# Patient Record
Sex: Female | Born: 2000
Health system: Southern US, Community
[De-identification: ages and names within clinical notes are randomized; demographics above are authoritative.]

## PROBLEM LIST (undated history)

## (undated) DIAGNOSIS — C801 Malignant (primary) neoplasm, unspecified: Secondary | ICD-10-CM

## (undated) DIAGNOSIS — E282 Polycystic ovarian syndrome: Secondary | ICD-10-CM

## (undated) DIAGNOSIS — K219 Gastro-esophageal reflux disease without esophagitis: Secondary | ICD-10-CM

## (undated) DIAGNOSIS — F419 Anxiety disorder, unspecified: Secondary | ICD-10-CM

## (undated) DIAGNOSIS — J45909 Unspecified asthma, uncomplicated: Secondary | ICD-10-CM

## (undated) DIAGNOSIS — Z8489 Family history of other specified conditions: Secondary | ICD-10-CM

## (undated) DIAGNOSIS — F32A Depression, unspecified: Secondary | ICD-10-CM

## (undated) DIAGNOSIS — G43909 Migraine, unspecified, not intractable, without status migrainosus: Secondary | ICD-10-CM

## (undated) HISTORY — DX: Gastro-esophageal reflux disease without esophagitis: K21.9

## (undated) HISTORY — DX: Depression, unspecified: F32.A

## (undated) HISTORY — DX: Anxiety disorder, unspecified: F41.9

## (undated) HISTORY — PX: WISDOM TOOTH EXTRACTION: SHX21

## (undated) HISTORY — DX: Migraine, unspecified, not intractable, without status migrainosus: G43.909

---
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
## 2020-09-17 ENCOUNTER — Other Ambulatory Visit: Payer: Self-pay

## 2020-09-17 ENCOUNTER — Inpatient Hospital Stay: Payer: 59

## 2020-09-17 ENCOUNTER — Encounter: Payer: Self-pay | Admitting: Cardiology

## 2020-09-17 ENCOUNTER — Ambulatory Visit: Payer: 59 | Admitting: Cardiology

## 2020-09-17 VITALS — BP 136/81 | HR 80 | Temp 98.1°F | Resp 16 | Ht 66.0 in | Wt 230.0 lb

## 2020-09-17 DIAGNOSIS — R002 Palpitations: Secondary | ICD-10-CM | POA: Insufficient documentation

## 2020-09-17 DIAGNOSIS — I499 Cardiac arrhythmia, unspecified: Secondary | ICD-10-CM

## 2020-09-17 DIAGNOSIS — R0789 Other chest pain: Secondary | ICD-10-CM

## 2020-09-17 NOTE — Progress Notes (Signed)
Patient referred by Nickola Major, MD for irregular heartbeat  Subjective:   Kiara Harris, female    DOB: 04-Oct-2000, 20 y.o.   MRN: [id number]   Chief Complaint  Patient presents with  . chest pain  . arrthythmia  . New Patient (Initial Visit)     HPI  20 y.o. Caucasian female with irregular heartbeat  Patient is a sophomore in college, plays softball.  For last few days, she has had episodes of "flutter sensation", and "irregular heartbeat", lasting for up to 10-15 minutes, associated with lightheadedness and chest pain.  She denies any shortness of breath, syncope symptoms.  She is able to participate in her sports activity without any difficulty.  She was referred by her PCP to undergo cardiac evaluation.   Past Medical History:  Diagnosis Date  . Acid reflux   . Anxiety and depression   . Asthma   . Migraine      History reviewed. No pertinent surgical history.   Social History   Tobacco Use  Smoking Status Never Smoker  Smokeless Tobacco Never Used    Social History   Substance and Sexual Activity  Alcohol Use No     History reviewed. No pertinent family history.   Current Outpatient Medications on File Prior to Visit  Medication Sig Dispense Refill  . albuterol (PROVENTIL HFA;VENTOLIN HFA) 108 (90 BASE) MCG/ACT inhaler Inhale two puffs prior to exercise 1 Inhaler 1  . albuterol (PROVENTIL) (2.5 MG/3ML) 0.083% nebulizer solution Take 2.5 mg by nebulization every 4 (four) hours as needed for wheezing.    . Butalbital-APAP-Caffeine 50-300-40 MG CAPS Take 1 capsule by mouth every 8 (eight) hours.    Marland Kitchen doxycycline (MONODOX) 50 MG capsule Take 50 mg by mouth 2 (two) times daily.    . Norgestimate-Ethinyl Estradiol Triphasic 0.18/0.215/0.25 MG-35 MCG tablet Take 1 tablet by mouth daily.    . ondansetron (ZOFRAN-ODT) 4 MG disintegrating tablet Take 4 mg by mouth every 8 (eight) hours as needed.    . pantoprazole (PROTONIX) 40 MG tablet Take 40 mg  by mouth 2 (two) times daily.     No current facility-administered medications on file prior to visit.    Cardiovascular and other pertinent studies:  EKG 09/17/2020: Sinus rhythm 67 bpm   Recent labs: 06/06/2020: Glucose 75, BUN/Cr 9/0.77. EGFR 138. Na/K 138/4.3. Rest of the CMP normal H/H 13/41. MCV 88. Platelets 291 TSH 1.7  normal    Review of Systems  Cardiovascular: Positive for chest pain and palpitations. Negative for dyspnea on exertion, leg swelling and syncope.         Vitals:   09/17/20 1442  BP: 136/81  Pulse: 80  Resp: 16  Temp: 98.1 F (36.7 C)  SpO2: 98%     Body mass index is 37.12 kg/m. Filed Weights   09/17/20 1442  Weight: 230 lb (104.3 kg)     Objective:   Physical Exam Vitals and nursing note reviewed.  Constitutional:      General: She is not in acute distress. Neck:     Vascular: No JVD.  Cardiovascular:     Rate and Rhythm: Normal rate and regular rhythm.     Pulses: Normal pulses.     Heart sounds: Normal heart sounds. No murmur heard.   Pulmonary:     Effort: Pulmonary effort is normal.     Breath sounds: Normal breath sounds. No wheezing or rales.  Musculoskeletal:     Right lower leg: No edema.  Left lower leg: No edema.            Assessment & Recommendations:   20 y.o. Caucasian female with irregular heartbeat  Soft systolic murmur, normal resting EKG.  Chest pain likely related to episodes of tachycardia.  Discussed vagal maneuvers.  I do not see any contraindication to continue participating in sports, unless she develops any exercise-induced symptoms.  Will obtain echocardiogram and 1 week cardiac telemetry.  Further recommendations after above testing.  Thank you for referring the patient to [place]. Please feel free to contact with any questions.   Nigel Mormon, MD Pager: [phone] Office: [phone]

## 2020-09-30 ENCOUNTER — Ambulatory Visit: Payer: 59

## 2020-09-30 ENCOUNTER — Other Ambulatory Visit: Payer: Self-pay

## 2020-09-30 DIAGNOSIS — R0789 Other chest pain: Secondary | ICD-10-CM

## 2020-09-30 DIAGNOSIS — I499 Cardiac arrhythmia, unspecified: Secondary | ICD-10-CM

## 2020-10-05 NOTE — Progress Notes (Signed)
Patient referred by Nickola Major, MD for irregular heartbeat  Subjective:   Kiara Harris, female    DOB: 05/28/2001, 20 y.o.   MRN: [id number]   Chief Complaint  Patient presents with  . Palpitations  . Follow-up  . Results     HPI  20 y.o. Caucasian female with irregular heartbeat  Workup was unremarkable, details below.  She did not have any palpitations episode but she will monitor.  However, 2 days after she stopped a monitor, she had episode of  tachycardia and dizziness during her softball game.  She was able to sit, relax and overcome this episode.  Initial consultation HPI 09/2020: Patient is a sophomore in college, plays softball.  For last few days, she has had episodes of "flutter sensation", and "irregular heartbeat", lasting for up to 10-15 minutes, associated with lightheadedness and chest pain.  She denies any shortness of breath, syncope symptoms.  She is able to participate in her sports activity without any difficulty.  She was referred by her PCP to undergo cardiac evaluation.   Current Outpatient Medications on File Prior to Visit  Medication Sig Dispense Refill  . albuterol (PROVENTIL HFA;VENTOLIN HFA) 108 (90 BASE) MCG/ACT inhaler Inhale two puffs prior to exercise 1 Inhaler 1  . albuterol (PROVENTIL) (2.5 MG/3ML) 0.083% nebulizer solution Take 2.5 mg by nebulization every 4 (four) hours as needed for wheezing.    . Butalbital-APAP-Caffeine 50-300-40 MG CAPS Take 1 capsule by mouth every 8 (eight) hours.    Marland Kitchen doxycycline (MONODOX) 50 MG capsule Take 50 mg by mouth 2 (two) times daily.    . Norgestimate-Ethinyl Estradiol Triphasic 0.18/0.215/0.25 MG-35 MCG tablet Take 1 tablet by mouth daily.    . ondansetron (ZOFRAN-ODT) 4 MG disintegrating tablet Take 4 mg by mouth every 8 (eight) hours as needed.    . pantoprazole (PROTONIX) 40 MG tablet Take 40 mg by mouth 2 (two) times daily.     No current facility-administered medications on file prior to  visit.    Cardiovascular and other pertinent studies:  Echocardiogram 09/30/2020: Left ventricle cavity is normal in size and wall thickness. Normal global wall motion. Normal LV systolic function with EF 65%. Normal diastolic filling pattern.  No significant valvular abnormality. Normal right atrial pressure.   Mobile cardiac telemetry 6 days 09/17/2020 - 09/24/2020: Dominant rhythm: Sinus. HR 42-196 bpm. Avg HR 75 bpm. <1% isolated SVE, couplet/triplets. <1% isolated VE, couplets No atrial fibrillation/atrial flutter/SVT/VT/high grade AV block, sinus pause >3sec noted. 0 patient triggered events.     EKG 09/17/2020: Sinus rhythm 67 bpm  Recent labs: 06/06/2020: Glucose 75, BUN/Cr 9/0.77. EGFR 138. Na/K 138/4.3. Rest of the CMP normal H/H 13/41. MCV 88. Platelets 291 TSH 1.7  normal    Review of Systems  Cardiovascular: Positive for chest pain and palpitations. Negative for dyspnea on exertion, leg swelling and syncope.         Vitals:   10/06/20 0902  BP: 128/68  Pulse: 60  Temp: 98.1 F (36.7 C)  SpO2: 98%     Body mass index is 37.45 kg/m. Filed Weights   10/06/20 0902  Weight: 232 lb (105.2 kg)     Objective:   Physical Exam Vitals and nursing note reviewed.  Constitutional:      General: She is not in acute distress. Neck:     Vascular: No JVD.  Cardiovascular:     Rate and Rhythm: Normal rate and regular rhythm.     Pulses: Normal  pulses.     Heart sounds: Normal heart sounds. No murmur heard.   Pulmonary:     Effort: Pulmonary effort is normal.     Breath sounds: Normal breath sounds. No wheezing or rales.  Musculoskeletal:     Right lower leg: No edema.     Left lower leg: No edema.            Assessment & Recommendations:   20 y.o. Caucasian female with irregular heartbeat  Workup with echocardiogram and cardiac monitor was essentially normal.  She had one episode of tachycardia after taking the monitor off.  Recommend  conservative therapy with albumin was found.  If she has increase frequency or severity of symptoms, could repeat monitor.  Suspicion for major ischemia is low.  F 3 months/u in    Nigel Mormon, MD Pager: [phone] Office: [phone]

## 2020-10-06 ENCOUNTER — Ambulatory Visit: Payer: 59 | Admitting: Cardiology

## 2020-10-06 ENCOUNTER — Encounter: Payer: Self-pay | Admitting: Cardiology

## 2020-10-06 ENCOUNTER — Telehealth: Payer: Self-pay

## 2020-10-06 ENCOUNTER — Other Ambulatory Visit: Payer: Self-pay

## 2020-10-06 VITALS — BP 128/68 | HR 60 | Temp 98.1°F | Ht 66.0 in | Wt 232.0 lb

## 2020-10-06 DIAGNOSIS — R0789 Other chest pain: Secondary | ICD-10-CM

## 2020-10-06 DIAGNOSIS — I499 Cardiac arrhythmia, unspecified: Secondary | ICD-10-CM

## 2020-10-06 DIAGNOSIS — R002 Palpitations: Secondary | ICD-10-CM

## 2020-10-06 NOTE — Telephone Encounter (Signed)
-----   Message from Nigel Mormon, MD sent at 10/06/2020  9:13 AM EST ----- Regarding: Appt & TOC Discharge follow up: TOC: Needed Follow up appt: Needed in 1-2 weeks, echcoardiogram in AM and appt in early PM Discharge diagnosis: Heart failure, pericardial effusion Discharge date: 2/28  Thanks MJP

## 2020-10-08 ENCOUNTER — Telehealth: Payer: Self-pay

## 2020-10-08 NOTE — Telephone Encounter (Signed)
-----   Message from Tera Partridge sent at 10/08/2020 10:27 AM EST ----- Regarding: RE: Appt & TOC Ladies - can a addendum be noted on this account that a TOC was incorrectly entered on this pt.  Thanks.  Anabell  ----- Message ----- From: Nigel Mormon, MD Sent: 10/06/2020   4:04 PM EST To: [facility], # Subject: FW: Appt & TOC                                 Disregard. Incorrect patient  Thanks MJP   ----- Message ----- From: Nigel Mormon, MD Sent: 10/06/2020   9:14 AM EST To: [facility], # Subject: Appt & TOC                                     Discharge follow up: TOC: Needed Follow up appt: Needed in 1-2 weeks, echcoardiogram in AM and appt in early PM Discharge diagnosis: Heart failure, pericardial effusion Discharge date: 2/28  Thanks MJP

## 2020-12-31 NOTE — Progress Notes (Signed)
Patient referred by Nickola Major, MD for irregular heartbeat  Subjective:   Kiara Harris, female    DOB: [birth year], 20 y.o.   MRN: [id number]   Chief Complaint  Patient presents with  . Irregular Heart Beat  . Palpitations  . Follow-up    3 month     HPI  20 y.o. Caucasian female with irregular heartbeat, chest pain  Workup was unremarkable, details below.  Patient is here today with her mother. Her softball season is over, so currently not actively playing sport. She had one episode first week of April. She had chest pain, whole body pain, shortness of breath, and lightheadedness during a game. She does not remember being stressed. Episode lasted for about an hour. She had to be taken off from the game. That said, she was able to participate in subsequent games without any symptoms.   Initial consultation HPI 09/2020: Patient is a sophomore in college, plays softball.  For last few days, she has had episodes of "flutter sensation", and "irregular heartbeat", lasting for up to 10-15 minutes, associated with lightheadedness and chest pain.  She denies any shortness of breath, syncope symptoms.  She is able to participate in her sports activity without any difficulty.  She was referred by her PCP to undergo cardiac evaluation.   Current Outpatient Medications on File Prior to Visit  Medication Sig Dispense Refill  . albuterol (PROVENTIL HFA;VENTOLIN HFA) 108 (90 BASE) MCG/ACT inhaler Inhale two puffs prior to exercise 1 Inhaler 1  . albuterol (PROVENTIL) (2.5 MG/3ML) 0.083% nebulizer solution Take 2.5 mg by nebulization every 4 (four) hours as needed for wheezing.    . Butalbital-APAP-Caffeine 50-300-40 MG CAPS Take 1 capsule by mouth every 8 (eight) hours.    Marland Kitchen doxycycline (MONODOX) 50 MG capsule Take 50 mg by mouth 2 (two) times daily.    . Norgestimate-Ethinyl Estradiol Triphasic 0.18/0.215/0.25 MG-35 MCG tablet Take 1 tablet by mouth daily.    . ondansetron  (ZOFRAN-ODT) 4 MG disintegrating tablet Take 4 mg by mouth every 8 (eight) hours as needed.    . pantoprazole (PROTONIX) 40 MG tablet Take 40 mg by mouth 2 (two) times daily.     No current facility-administered medications on file prior to visit.    Cardiovascular and other pertinent studies:  Echocardiogram 09/30/2020: Left ventricle cavity is normal in size and wall thickness. Normal global wall motion. Normal LV systolic function with EF 65%. Normal diastolic filling pattern.  No significant valvular abnormality. Normal right atrial pressure.   Mobile cardiac telemetry 6 days 09/17/2020 - 09/24/2020: Dominant rhythm: Sinus. HR 42-196 bpm. Avg HR 75 bpm. <1% isolated SVE, couplet/triplets. <1% isolated VE, couplets No atrial fibrillation/atrial flutter/SVT/VT/high grade AV block, sinus pause >3sec noted. 0 patient triggered events.     EKG 09/17/2020: Sinus rhythm 67 bpm  Recent labs: 06/06/2020: Glucose 75, BUN/Cr 9/0.77. EGFR 138. Na/K 138/4.3. Rest of the CMP normal H/H 13/41. MCV 88. Platelets 291 TSH 1.7  normal    Review of Systems  Cardiovascular: Positive for chest pain and palpitations. Negative for dyspnea on exertion, leg swelling and syncope.         Vitals:   01/01/21 0914  BP: 132/86  Pulse: 74  Resp: 16  Temp: 98 F (36.7 C)  SpO2: 98%     Body mass index is 37.12 kg/m. Filed Weights   01/01/21 0914  Weight: 230 lb (104.3 kg)     Objective:   Physical Exam Vitals and  nursing note reviewed.  Constitutional:      General: She is not in acute distress. Neck:     Vascular: No JVD.  Cardiovascular:     Rate and Rhythm: Normal rate and regular rhythm.     Pulses: Normal pulses.     Heart sounds: Normal heart sounds. No murmur heard.   Pulmonary:     Effort: Pulmonary effort is normal.     Breath sounds: Normal breath sounds. No wheezing or rales.  Musculoskeletal:     Right lower leg: No edema.     Left lower leg: No edema.             Assessment & Recommendations:   20 y.o. Caucasian female with irregular heartbeat, chest pain, shortness of breath  Workup with echocardiogram and cardiac monitor was essentially normal.  She had one episode of tachycardia after taking the monitor off. Infrequent episodes of chest pain and shortness of breath, not reproducible every time she exerts. I suspect these episodes may be panic attacks. Nonetheless, I will perform treadmill stress test for reassurance. If stress test is normal, I recommend her to discuss with her PCP re: possibility of workup and management for panic attacks.   Nigel Mormon, MD Pager: [phone] Office: [phone]

## 2021-01-01 ENCOUNTER — Other Ambulatory Visit: Payer: Self-pay

## 2021-01-01 ENCOUNTER — Ambulatory Visit: Payer: 59 | Admitting: Cardiology

## 2021-01-01 ENCOUNTER — Encounter: Payer: Self-pay | Admitting: Cardiology

## 2021-01-01 VITALS — BP 132/86 | HR 74 | Temp 98.0°F | Resp 16 | Ht 66.0 in | Wt 230.0 lb

## 2021-01-01 DIAGNOSIS — R002 Palpitations: Secondary | ICD-10-CM

## 2021-01-01 DIAGNOSIS — I499 Cardiac arrhythmia, unspecified: Secondary | ICD-10-CM

## 2021-01-01 DIAGNOSIS — R0789 Other chest pain: Secondary | ICD-10-CM

## 2021-07-24 ENCOUNTER — Other Ambulatory Visit: Payer: Self-pay | Admitting: Family Medicine

## 2021-07-24 DIAGNOSIS — M545 Low back pain, unspecified: Secondary | ICD-10-CM

## 2021-07-31 ENCOUNTER — Ambulatory Visit
Admission: RE | Admit: 2021-07-31 | Discharge: 2021-07-31 | Disposition: A | Discharge status: Home / Self Care | Payer: Self-pay | Source: Ambulatory Visit | Attending: Family Medicine | Admitting: Family Medicine

## 2021-07-31 ENCOUNTER — Other Ambulatory Visit: Payer: Self-pay

## 2021-07-31 DIAGNOSIS — G8929 Other chronic pain: Secondary | ICD-10-CM

## 2021-08-06 ENCOUNTER — Other Ambulatory Visit: Payer: Self-pay | Admitting: Family Medicine

## 2021-08-06 DIAGNOSIS — N2889 Other specified disorders of kidney and ureter: Secondary | ICD-10-CM

## 2021-08-07 ENCOUNTER — Ambulatory Visit
Admission: RE | Admit: 2021-08-07 | Discharge: 2021-08-07 | Disposition: A | Discharge status: Home / Self Care | Payer: 59 | Source: Ambulatory Visit | Attending: Family Medicine | Admitting: Family Medicine

## 2021-08-07 ENCOUNTER — Other Ambulatory Visit: Payer: Self-pay

## 2021-08-07 DIAGNOSIS — N2889 Other specified disorders of kidney and ureter: Secondary | ICD-10-CM

## 2021-08-07 MED ORDER — DIPHENHYDRAMINE HCL 50 MG/ML IJ SOLN
50.0000 mg | Freq: Once | INTRAMUSCULAR | Status: AC
Start: 2021-08-07 — End: 2021-08-07
  Administered 2021-08-07: 50 mg via INTRAVENOUS

## 2021-08-07 MED ORDER — GADOBENATE DIMEGLUMINE 529 MG/ML IV SOLN
20.0000 mL | Freq: Once | INTRAVENOUS | Status: AC | PRN
  Administered 2021-08-07: 20 mL via INTRAVENOUS

## 2021-08-07 NOTE — Progress Notes (Signed)
Pt brought over to nursing recovery unit post MRI scan with contrast due to patient complaining of itchy throat and itchy hands. No notable hives, swelling, or distress. Pt has no other complaints at this time and denies SOB or difficulty breathing. Dr. Jeralyn Ruths saw pt and assessed her with her mother at her side. 50 mg IV benadryl was given, per dr. Jeralyn Ruths. Pt reported itching resolved and will be discharged from facility per dr. Jeralyn Ruths. Pt and her mother advised that if patient were to need MRI contrast again that she would require a 13 hr prep. Pt/cg verbalized understanding.

## 2021-08-07 NOTE — Progress Notes (Signed)
I was called to see the patient in the nursing recovery area at [facility] for a contrast reaction following an MRI. The patient reported itchy/scratchy throat and mild hand itching. She was in no distress, was speaking in complete sentences, had no hives, and denied difficulty breathing. No concerning vital signs. 50 mg IV Benadryl given. Symptoms resolved at time of discharge. Patient and her mother were informed to seek immediate medical attention for any recurrent or new symptoms.

## 2021-08-12 ENCOUNTER — Other Ambulatory Visit: Payer: 59

## 2021-08-14 ENCOUNTER — Other Ambulatory Visit: Payer: Self-pay | Admitting: Family Medicine

## 2021-08-14 DIAGNOSIS — K862 Cyst of pancreas: Secondary | ICD-10-CM

## 2021-10-20 ENCOUNTER — Ambulatory Visit: Payer: 59 | Admitting: Radiology

## 2021-10-20 ENCOUNTER — Other Ambulatory Visit: Payer: Self-pay | Admitting: Orthopedic Surgery

## 2021-10-20 DIAGNOSIS — R531 Weakness: Secondary | ICD-10-CM

## 2021-10-20 DIAGNOSIS — R52 Pain, unspecified: Secondary | ICD-10-CM

## 2022-01-11 ENCOUNTER — Telehealth: Payer: Self-pay

## 2022-01-11 MED ORDER — PREDNISONE 50 MG PO TABS: ORAL_TABLET | ORAL | 0 refills | Status: DC

## 2022-01-11 MED ORDER — DIPHENHYDRAMINE HCL 50 MG PO TABS: 50.0000 mg | ORAL_TABLET | Freq: Once | ORAL | 0 refills | Status: DC

## 2022-01-11 NOTE — Telephone Encounter (Signed)
Phone call to patient, spoke to pts mother, to review instructions for 13 hr prep for MRI w/ contrast on 01/19/22 @ 8 AM. Prescription called into [facility]. Pt aware and verbalized understanding of instructions. Prescription: Pt to take 50 mg of prednisone on 01/18/22 @ 7pm, 50 mg of prednisone on 01/19/22 at 1 AM, and 50 mg of prednisone on 01/19/22 @ 7 AM. Pt is also to take 50 mg of benadryl on 01/19/22 at 7 AM. Please call [phone] with any questions.    Benadryl also sent in as a prescription, per the pts mother request. Pt/cg advised not to drive the day of taking this medication as it mau cause drowsiness. Pt/cg verbalized understanding.

## 2022-01-19 ENCOUNTER — Ambulatory Visit
Admission: RE | Admit: 2022-01-19 | Discharge: 2022-01-19 | Disposition: A | Discharge status: Home / Self Care | Payer: 59 | Source: Ambulatory Visit | Attending: Family Medicine | Admitting: Family Medicine

## 2022-01-19 DIAGNOSIS — K862 Cyst of pancreas: Secondary | ICD-10-CM

## 2022-01-19 MED ORDER — GADOBENATE DIMEGLUMINE 529 MG/ML IV SOLN
20.0000 mL | Freq: Once | INTRAVENOUS | Status: AC | PRN
Start: 2022-01-19 — End: 2022-01-19
  Administered 2022-01-19: 20 mL via INTRAVENOUS

## 2022-03-23 ENCOUNTER — Telehealth: Payer: Self-pay

## 2022-03-23 NOTE — Telephone Encounter (Signed)
-----   Message from Irving Copas., MD sent at 03/23/2022  1:07 PM EDT ----- Regarding: RE: EUS SA, Thanks for reaching out. This has not come through yet for Chanin Frumkin or I, though I do see the referral in the system.   EGD/EUS makes sense to try to better define and ensure that this is just an SCA.  The very small chance that this could be coming from the kidney will hopefully be able to be further examined/evaluated.  Bernese Doffing, Please reach out to the patient and offer her next available EGD/EUS linear with me.  Please let Dr. Zenia Resides and I know when she is scheduled. Thanks. GM ----- Message ----- From: Dwan Bolt, MD Sent: 03/23/2022   9:04 AM EDT To: Irving Copas., MD Subject: EUS                                            GM, I referred this patient with a pancreatic cyst for EUS a few weeks ago, just wanted to follow up and make sure she gets scheduled at some point. I know you guys are very busy right now, it's not urgent, I just wanted to make sure she gets follow up. Thanks, [place]

## 2022-03-24 ENCOUNTER — Other Ambulatory Visit: Payer: Self-pay

## 2022-03-24 DIAGNOSIS — K862 Cyst of pancreas: Secondary | ICD-10-CM

## 2022-03-24 NOTE — Telephone Encounter (Signed)
EGD EUS has been scheduled on 05/27/22 at 1015 am at [facility] with GM  Left message on machine to call back

## 2022-03-25 NOTE — Telephone Encounter (Signed)
PT mother called to get a new date for the EGD scheduled for 10/19. She is starting clinicals and needs it to be either the week before on a Tuesday or Thursday, or any time after 12/12. Please advise.

## 2022-03-25 NOTE — Telephone Encounter (Signed)
The pt has been moved to 10/9 at 115 pm with GM at [facility]. She has been re instructed and new information sent to My Chart.

## 2022-05-10 ENCOUNTER — Encounter (HOSPITAL_COMMUNITY): Payer: Self-pay | Admitting: Gastroenterology

## 2022-05-17 ENCOUNTER — Ambulatory Visit (HOSPITAL_COMMUNITY): Payer: 59 | Admitting: Certified Registered"

## 2022-05-17 ENCOUNTER — Ambulatory Visit (HOSPITAL_COMMUNITY)
Admission: RE | Admit: 2022-05-17 | Discharge: 2022-05-17 | Disposition: A | Discharge status: Home / Self Care | Payer: 59 | Attending: Gastroenterology | Admitting: Gastroenterology

## 2022-05-17 ENCOUNTER — Other Ambulatory Visit: Payer: Self-pay

## 2022-05-17 ENCOUNTER — Encounter (HOSPITAL_COMMUNITY): Payer: Self-pay | Admitting: Gastroenterology

## 2022-05-17 ENCOUNTER — Ambulatory Visit (HOSPITAL_BASED_OUTPATIENT_CLINIC_OR_DEPARTMENT_OTHER): Payer: 59 | Admitting: Certified Registered"

## 2022-05-17 ENCOUNTER — Encounter (HOSPITAL_COMMUNITY)
Admission: RE | Disposition: A | Discharge status: Home / Self Care | Payer: Self-pay | Source: Home / Self Care | Attending: Gastroenterology

## 2022-05-17 DIAGNOSIS — I899 Noninfective disorder of lymphatic vessels and lymph nodes, unspecified: Secondary | ICD-10-CM | POA: Diagnosis not present

## 2022-05-17 DIAGNOSIS — K862 Cyst of pancreas: Secondary | ICD-10-CM | POA: Insufficient documentation

## 2022-05-17 DIAGNOSIS — K838 Other specified diseases of biliary tract: Secondary | ICD-10-CM | POA: Insufficient documentation

## 2022-05-17 DIAGNOSIS — K219 Gastro-esophageal reflux disease without esophagitis: Secondary | ICD-10-CM | POA: Insufficient documentation

## 2022-05-17 DIAGNOSIS — K3189 Other diseases of stomach and duodenum: Secondary | ICD-10-CM | POA: Insufficient documentation

## 2022-05-17 DIAGNOSIS — J45909 Unspecified asthma, uncomplicated: Secondary | ICD-10-CM | POA: Insufficient documentation

## 2022-05-17 DIAGNOSIS — F418 Other specified anxiety disorders: Secondary | ICD-10-CM | POA: Diagnosis not present

## 2022-05-17 DIAGNOSIS — K2289 Other specified disease of esophagus: Secondary | ICD-10-CM | POA: Insufficient documentation

## 2022-05-17 DIAGNOSIS — K2970 Gastritis, unspecified, without bleeding: Secondary | ICD-10-CM | POA: Diagnosis not present

## 2022-05-17 DIAGNOSIS — K828 Other specified diseases of gallbladder: Secondary | ICD-10-CM | POA: Diagnosis not present

## 2022-05-17 HISTORY — PX: ESOPHAGOGASTRODUODENOSCOPY (EGD) WITH PROPOFOL: SHX5813

## 2022-05-17 HISTORY — PX: BIOPSY: SHX5522

## 2022-05-17 HISTORY — PX: EUS: SHX5427

## 2022-05-17 HISTORY — PX: FINE NEEDLE ASPIRATION: SHX5430

## 2022-05-17 LAB — PREGNANCY, URINE: Preg Test, Ur: NEGATIVE

## 2022-05-17 SURGERY — ESOPHAGOGASTRODUODENOSCOPY (EGD) WITH PROPOFOL
Anesthesia: Monitor Anesthesia Care

## 2022-05-17 MED ORDER — PROPOFOL 10 MG/ML IV BOLUS
INTRAVENOUS | Status: DC | PRN
  Administered 2022-05-17: 20 mg via INTRAVENOUS
  Administered 2022-05-17: 10 mg via INTRAVENOUS
  Administered 2022-05-17: 20 mg via INTRAVENOUS
  Administered 2022-05-17: 10 mg via INTRAVENOUS
  Administered 2022-05-17: 70 mg via INTRAVENOUS

## 2022-05-17 MED ORDER — CIPROFLOXACIN HCL 500 MG PO TABS: 500.0000 mg | ORAL_TABLET | Freq: Two times a day (BID) | ORAL | 0 refills | Status: AC

## 2022-05-17 MED ORDER — LACTATED RINGERS IV SOLN: INTRAVENOUS | Status: DC

## 2022-05-17 MED ORDER — SODIUM CHLORIDE 0.9 % IV SOLN: INTRAVENOUS | Status: DC

## 2022-05-17 MED ORDER — LIDOCAINE 2% (20 MG/ML) 5 ML SYRINGE
INTRAMUSCULAR | Status: DC | PRN
  Administered 2022-05-17: 40 mg via INTRAVENOUS

## 2022-05-17 MED ORDER — CIPROFLOXACIN IN D5W 400 MG/200ML IV SOLN
INTRAVENOUS | Status: AC
  Filled 2022-05-17: qty 200

## 2022-05-17 MED ORDER — CIPROFLOXACIN IN D5W 400 MG/200ML IV SOLN
INTRAVENOUS | Status: DC | PRN
  Administered 2022-05-17: 400 mg via INTRAVENOUS

## 2022-05-17 MED ORDER — PROPOFOL 500 MG/50ML IV EMUL
INTRAVENOUS | Status: DC | PRN
  Administered 2022-05-17: 150 ug/kg/min via INTRAVENOUS

## 2022-05-17 SURGICAL SUPPLY — 15 items

## 2022-05-17 NOTE — Anesthesia Preprocedure Evaluation (Addendum)
Anesthesia Evaluation  Patient identified by MRN, date of birth, ID band Patient awake    Reviewed: Allergy & Precautions, NPO status , Patient's Chart, lab work & pertinent test results  Airway Mallampati: I  TM Distance: >3 FB Neck ROM: Full    Dental  (+) Teeth Intact, Dental Advisory Given   Pulmonary neg shortness of breath, asthma , neg sleep apnea, neg COPD, neg recent URI,    breath sounds clear to auscultation       Cardiovascular negative cardio ROS   Rhythm:Regular     Neuro/Psych  Headaches, neg Seizures PSYCHIATRIC DISORDERS Anxiety Depression    GI/Hepatic Neg liver ROS, GERD  ,panc cyst   Endo/Other  negative endocrine ROS  Renal/GU negative Renal ROS     Musculoskeletal negative musculoskeletal ROS (+)   Abdominal   Peds  Hematology negative hematology ROS (+)   Anesthesia Other Findings   Reproductive/Obstetrics negative OB ROS                            Anesthesia Physical Anesthesia Plan  ASA: 2  Anesthesia Plan: MAC   Post-op Pain Management: Minimal or no pain anticipated   Induction: Intravenous  PONV Risk Score and Plan: 2 and Propofol infusion and Treatment may vary due to age or medical condition  Airway Management Planned: Nasal Cannula and Natural Airway  Additional Equipment: None  Intra-op Plan:   Post-operative Plan:   Informed Consent: I have reviewed the patients History and Physical, chart, labs and discussed the procedure including the risks, benefits and alternatives for the proposed anesthesia with the patient or authorized representative who has indicated his/her understanding and acceptance.     Dental advisory given  Plan Discussed with: CRNA  Anesthesia Plan Comments:        Anesthesia Quick Evaluation

## 2022-05-17 NOTE — Transfer of Care (Signed)
Immediate Anesthesia Transfer of Care Note  Patient: Cleora P Santibanez  Procedure(s) Performed: ESOPHAGOGASTRODUODENOSCOPY (EGD) WITH PROPOFOL UPPER ENDOSCOPIC ULTRASOUND (EUS) LINEAR BIOPSY FINE NEEDLE ASPIRATION (FNA) LINEAR  Patient Location: PACU  Anesthesia Type:MAC  Level of Consciousness: awake, alert , oriented and patient cooperative  Airway & Oxygen Therapy: Patient Spontanous Breathing and Patient connected to face mask oxygen  Post-op Assessment: Report given to RN and Post -op Vital signs reviewed and stable  Post vital signs: Reviewed and stable  Last Vitals:  Vitals Value Taken Time  BP 97/33 05/17/22 1330  Temp    Pulse 58 05/17/22 1331  Resp 20 05/17/22 1331  SpO2 99 % 05/17/22 1331  Vitals shown include unvalidated device data.  Last Pain:  Vitals:   05/17/22 1205  TempSrc: Temporal  PainSc: 6       Patients Stated Pain Goal: 4 (45/73/34 4830)  Complications: No notable events documented.

## 2022-05-17 NOTE — Discharge Instructions (Signed)
YOU HAD AN ENDOSCOPIC PROCEDURE TODAY: Refer to the procedure report and other information in the discharge instructions given to you for any specific questions about what was found during the examination. If this information does not answer your questions, please call [place] office at [phone] to clarify.   YOU SHOULD EXPECT: Some feelings of bloating in the abdomen. Passage of more gas than usual. Walking can help get rid of the air that was put into your GI tract during the procedure and reduce the bloating. If you had a lower endoscopy (such as a colonoscopy or flexible sigmoidoscopy) you may notice spotting of blood in your stool or on the toilet paper. Some abdominal soreness may be present for a day or two, also.  DIET: Your first meal following the procedure should be a light meal and then it is ok to progress to your normal diet. A half-sandwich or bowl of soup is an example of a good first meal. Heavy or fried foods are harder to digest and may make you feel nauseous or bloated. Drink plenty of fluids but you should avoid alcoholic beverages for 24 hours. If you had a esophageal dilation, please see attached instructions for diet.    ACTIVITY: Your care partner should take you home directly after the procedure. You should plan to take it easy, moving slowly for the rest of the day. You can resume normal activity the day after the procedure however YOU SHOULD NOT DRIVE, use power tools, machinery or perform tasks that involve climbing or major physical exertion for 24 hours (because of the sedation medicines used during the test).   SYMPTOMS TO REPORT IMMEDIATELY: A gastroenterologist can be reached at any hour. Please call [phone]  for any of the following symptoms:   Following upper endoscopy (EGD, EUS, ERCP, esophageal dilation) Vomiting of blood or coffee ground material  New, significant abdominal pain  New, significant chest pain or pain under the shoulder blades  Painful or  persistently difficult swallowing  New shortness of breath  Black, tarry-looking or red, bloody stools  FOLLOW UP:  If any biopsies were taken you will be contacted by phone or by letter within the next 1-3 weeks. Call [phone]  if you have not heard about the biopsies in 3 weeks.  Please also call with any specific questions about appointments or follow up tests.  

## 2022-05-17 NOTE — Op Note (Addendum)
[facility] Patient Name: Kiara Harris Procedure Date: 05/17/2022 MRN: [id number] Attending MD: Justice Britain , MD Date of Birth: 20-Aug-2000 CSN: [id number] Age: 21 Admit Type: Outpatient Procedure:                Upper EUS Indications:              Pancreatic cyst on MRCP Providers:                Justice Britain, MD, Dulcy Fanny, Cletis Athens, Technician Referring MD:             Blanchard Mane. Allen Medicines:                Monitored Anesthesia Care Complications:            No immediate complications. Estimated Blood Loss:     Estimated blood loss was minimal. Procedure:                Pre-Anesthesia Assessment:                           - Prior to the procedure, a History and Physical                            was performed, and patient medications and                            allergies were reviewed. The patient's tolerance of                            previous anesthesia was also reviewed. The risks                            and benefits of the procedure and the sedation                            options and risks were discussed with the patient.                            All questions were answered, and informed consent                            was obtained. Prior Anticoagulants: The patient has                            taken no previous anticoagulant or antiplatelet                            agents except for NSAID medication. ASA Grade                            Assessment: II - A patient with mild systemic  disease. After reviewing the risks and benefits,                            the patient was deemed in satisfactory condition to                            undergo the procedure.                           After obtaining informed consent, the endoscope was                            passed under direct vision. Throughout the                            procedure, the patient's  blood pressure, pulse, and                            oxygen saturations were monitored continuously. The                            [id number]-H190 ([id number]) Olympus endoscope was introduced                            through the mouth, and advanced to the second part                            of duodenum. The [id number]-[id number] ([id number]) Olympus                            duodenoscope was introduced through the mouth, and                            advanced to the area of papilla. The [id number]                            ([id number]) Olympus linear ultrasound scope was                            introduced through the mouth, and advanced to the                            duodenum for ultrasound examination from the                            stomach and duodenum. The upper EUS was                            accomplished without difficulty. The patient                            tolerated the procedure. Scope In: Scope Out: Findings:      ENDOSCOPIC FINDING: :      No gross lesions were noted in the entire  esophagus.      The Z-line was irregular and was found 35 cm from the incisors.      Patchy mildly erythematous mucosa without bleeding was found in the       entire examined stomach. Biopsies were taken with a cold forceps for       histology and Helicobacter pylori testing.      No gross lesions were noted in the duodenal bulb, in the first portion       of the duodenum and in the second portion of the duodenum.      The major papilla was small but otherwise normal in appearance.      ENDOSONOGRAPHIC FINDING: :      An anechoic lesion suggestive of a cyst was identified in the very       distal pancreas tail/pancreatic tail region. It is not in obvious       communication with the pancreatic duct. The lesion measured 44 mm by 38       mm in maximal cross-sectional diameter. There were many compartments       thinly septated. The outer wall of the lesion was thin. There was no       associated mass.  There was no internal debris within the fluid-filled       cavity. As noted below, I did not see any abnormal echogenicity of the       left kidney and this did seem to be slightly away from the left kidney       itself. As such, it was important for [place] to get further diagnostic       evaluation. Thus a diagnostic needle aspiration for fluid was performed.       Color Doppler imaging was utilized prior to needle puncture to confirm a       lack of significant vascular structures within the needle path. One pass       was made with the [facility] scientific expect 22 gauge needle using a       transgastric approach. A stylet was used. The amount of fluid collected       was proximately 40 mL. The fluid was clear, white and thin. Sample(s)       were sent for amylase concentration, cytology, CEA, glucose, and       GNAS/KRAS mutation analysis (if CEA greater than 192 and/or glucose less       than 50).      Other than some hyperechoic stranding, there was no other sign of       significant endosonographic abnormality in the pancreatic head (1.7 mm),       genu of the pancreas (1.7 mm), pancreatic body (1.0 mm), pancreatic tail       (0.7 mm) and uncinate process of the pancreas. No masses, the pancreatic       duct was thin in caliber.      There was no sign of significant endosonographic abnormality in the       common bile duct (2.0 mm to 4.0 mm) and in the common hepatic duct (5.6       mm). No stones and ducts of normal caliber were identified.      A small amount of hyperechoic material consistent with sludge was       visualized endosonographically in the gallbladder.      Endosonographic imaging of the ampulla showed no intramural       (subepithelial) lesion.  Endosonographic imaging in the visualized portion of the liver showed no       mass.      No malignant-appearing lymph nodes were visualized in the celiac region       (level 20), peripancreatic region and porta hepatis  region.      The celiac region was visualized. Impression:               EGD impression:                           - No gross lesions in esophagus. Z-line irregular,                            35 cm from the incisors.                           - Erythematous mucosa in the stomach. Biopsied.                           - No gross lesions in the duodenal bulb, in the                            first portion of the duodenum and in the second                            portion of the duodenum.                           - A small but otherwise normal major papilla.                           EUS impression:                           - A cystic lesion was seen in the very distal                            pancreatic tail/pancreatic tail region. This looks                            to be separate from the left kidney. Cytology                            results are pending. However, the endosonographic                            appearance initially prior to initial aspiration                            was not typical of a serous cystadenoma but after                            aspiration of fluid had a more typical appearance  with some potential central scarring. Fine needle                            aspiration for fluid was performed.                           - Other than some hyperechoic stranding, there was                            no other sign of significant pathology in the                            pancreatic head, genu of the pancreas, pancreatic                            body, pancreatic tail and uncinate process of the                            pancreas.                           - There was no sign of significant pathology in the                            common bile duct and in the common hepatic duct.                           - Hyperechoic material consistent with sludge was                            visualized endosonographically in the  gallbladder.                           - No malignant-appearing lymph nodes were                            visualized in the celiac region (level 20),                            peripancreatic region and porta hepatis region. Moderate Sedation:      Not Applicable - Patient had care per Anesthesia. Recommendation:           - The patient will be observed post-procedure,                            until all discharge criteria are met.                           - Discharge patient to home.                           - Patient has a contact number available for  emergencies. The signs and symptoms of potential                            delayed complications were discussed with the                            patient. Return to normal activities tomorrow.                            Written discharge instructions were provided to the                            patient.                           - Low fat diet x1 week.                           - Ciprofloxacin 500 mg twice x3 days                           - Observe patient's clinical course.                           - Await cytology results and await path results.                           - Follow-up to be considered based on results of                            fluid analysis. This may include repeat MRI/MRCP                            imaging if this turns out to be benign fluid                            analysis versus if this is a mucinous cystic                            neoplasm the consideration of need for resection.                           - The findings and recommendations were discussed                            with the patient.                           - The findings and recommendations were discussed                            with the patient's family. Procedure Code(s):        --- Professional ---  43238, Esophagogastroduodenoscopy, flexible,                             transoral; with transendoscopic ultrasound-guided                            intramural or transmural fine needle                            aspiration/biopsy(s), (includes endoscopic                            ultrasound examination limited to the esophagus,                            stomach or duodenum, and adjacent structures) Diagnosis Code(s):        --- Professional ---                           K22.8, Other specified diseases of esophagus                           K31.89, Other diseases of stomach and duodenum                           K86.2, Cyst of pancreas                           I89.9, Noninfective disorder of lymphatic vessels                            and lymph nodes, unspecified                           K83.8, Other specified diseases of biliary tract CPT copyright 2019 [employer]. All rights reserved. The codes documented in this report are preliminary and upon coder review may  be revised to meet current compliance requirements. Justice Britain, MD 05/17/2022 1:44:16 PM Number of Addenda: 0

## 2022-05-17 NOTE — H&P (Signed)
GASTROENTEROLOGY PROCEDURE H&P NOTE   Primary Care Physician: Nickola Major, MD  HPI: Kiara Harris is a 21 y.o. female who presents for EGD/EUS to evaluate pancreatic tail cyst versus renal cyst and possible FNA.  Past Medical History:  Diagnosis Date   Acid reflux    Anxiety and depression    Asthma    Migraine    History reviewed. No pertinent surgical history. No current facility-administered medications for this encounter.   No current facility-administered medications for this encounter. Allergies  Allergen Reactions   Latex Other (See Comments)    With prolonged exposure=itching.   Amoxil [Amoxicillin] Rash   Multihance [Gadobenate] Itching    Hands and throat itching after MRI scan on 08/07/21. Pt would need 13 hr prep prior to MRI contrast. - Dr. Logan Bores.    History reviewed. No pertinent family history. Social History   Socioeconomic History   Marital status: Single    Spouse name: Not on file   Number of children: 0   Years of education: Not on file   Highest education level: Not on file  Occupational History   Not on file  Tobacco Use   Smoking status: Never   Smokeless tobacco: Never  Vaping Use   Vaping Use: Every day  Substance and Sexual Activity   Alcohol use: No   Drug use: No   Sexual activity: Not on file  Other Topics Concern   Not on file  Social History Narrative   Not on file   Social Determinants of Health   Financial Resource Strain: Not on file  Food Insecurity: Not on file  Transportation Needs: Not on file  Physical Activity: Not on file  Stress: Not on file  Social Connections: Not on file  Intimate Partner Violence: Not on file    Physical Exam: There were no vitals filed for this visit. There is no height or weight on file to calculate BMI. GEN: NAD EYE: Sclerae anicteric ENT: MMM CV: Non-tachycardic GI: Soft, NT/ND NEURO:  Alert & Oriented x 3  Lab Results: No results for input(s): "WBC", "HGB",  "HCT", "PLT" in the last 72 hours. BMET No results for input(s): "NA", "K", "CL", "CO2", "GLUCOSE", "BUN", "CREATININE", "CALCIUM" in the last 72 hours. LFT No results for input(s): "PROT", "ALBUMIN", "AST", "ALT", "ALKPHOS", "BILITOT", "BILIDIR", "IBILI" in the last 72 hours. PT/INR No results for input(s): "LABPROT", "INR" in the last 72 hours.   Impression / Plan: This is a 21 y.o.female who presents for EGD/EUS to evaluate pancreatic tail cyst versus renal cyst and possible FNA.  The risks of an EUS including intestinal perforation, bleeding, infection, aspiration, and medication effects were discussed as was the possibility it may not give a definitive diagnosis if a biopsy is performed.  When a biopsy of the pancreas is done as part of the EUS, there is an additional risk of pancreatitis at the rate of about 1-2%.  It was explained that procedure related pancreatitis is typically mild, although it can be severe and even life threatening, which is why we do not perform random pancreatic biopsies and only biopsy a lesion/area we feel is concerning enough to warrant the risk.   The risks and benefits of endoscopic evaluation/treatment were discussed with the patient and/or family; these include but are not limited to the risk of perforation, infection, bleeding, missed lesions, lack of diagnosis, severe illness requiring hospitalization, as well as anesthesia and sedation related illnesses.  The patient's history has been  reviewed, patient examined, no change in status, and deemed stable for procedure.  The patient and/or family is agreeable to proceed.    Justice Britain, MD [facility] Office # [phone]

## 2022-05-18 ENCOUNTER — Encounter: Payer: Self-pay | Admitting: Gastroenterology

## 2022-05-18 LAB — CYTOLOGY - NON PAP

## 2022-05-18 NOTE — Anesthesia Postprocedure Evaluation (Signed)
Anesthesia Post Note  Patient: Kiara Harris  Procedure(s) Performed: ESOPHAGOGASTRODUODENOSCOPY (EGD) WITH PROPOFOL UPPER ENDOSCOPIC ULTRASOUND (EUS) LINEAR BIOPSY FINE NEEDLE ASPIRATION (FNA) LINEAR     Patient location during evaluation: Endoscopy Anesthesia Type: MAC Level of consciousness: awake and alert Pain management: pain level controlled Vital Signs Assessment: post-procedure vital signs reviewed and stable Respiratory status: spontaneous breathing, nonlabored ventilation and respiratory function stable Cardiovascular status: stable and blood pressure returned to baseline Postop Assessment: no apparent nausea or vomiting Anesthetic complications: no   No notable events documented.  Last Vitals:  Vitals:   05/17/22 1350 05/17/22 1400  BP: 135/69 (!) 146/80  Pulse: (!) 54 (!) 48  Resp: 18 13  Temp:    SpO2: 100% 100%    Last Pain:  Vitals:   05/17/22 1400  TempSrc:   PainSc: 0-No pain                 Jess Toney

## 2022-05-20 ENCOUNTER — Encounter (HOSPITAL_COMMUNITY): Payer: Self-pay | Admitting: Gastroenterology

## 2022-05-20 LAB — SURGICAL PATHOLOGY

## 2022-05-25 ENCOUNTER — Telehealth: Payer: Self-pay | Admitting: Gastroenterology

## 2022-05-26 NOTE — Telephone Encounter (Signed)
I have sent this information to the pt PCP as directed

## 2022-05-26 NOTE — Telephone Encounter (Signed)
[facility] results  CEA 13,460 ng/mL Amylase 20 units/L Glucose 4.3 mg/dL  Cytopathology Neoplastic cells noted Epithelial atypia no atypia Cellularity moderate Cell composition scant epithelial cells and macrophages Mucin present Proteinaceous debris absent Inflammation absent  KRAS/GNAS testing is still pending   I called and spoke with the patient and her mother about the results of these findings. 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  We now also have in the fluid itself neoplastic appearing cells.  The next steps in her evaluation will be for follow-up with Dr. Zenia Resides to discuss the consideration of distal pancreatectomy for removal of this pancreatic cyst and the distal pancreas.  I briefly discussed the potential role of splenectomy that would likely be considered as well.  I also told the patient and mother that updated imaging may be considered as well as some additional blood tests. Dr. Zenia Resides and I discussed these results briefly and she will have our team reach out to the patient to get her set up for follow-up in clinic to discuss things further and consider additional laboratory evaluation/imaging if needed. We will update the team when the KRAS/GNAS testing returns. We will make copies of the results and have them scanned into the chart and sent to the patient for her records. All questions were answered to the best of my ability. I will be available as needed for any further issues in the future.   Kiara Britain, MD [facility] Office # [phone]

## 2022-05-28 ENCOUNTER — Encounter (HOSPITAL_COMMUNITY): Payer: Self-pay

## 2022-05-28 ENCOUNTER — Emergency Department (HOSPITAL_COMMUNITY)
Admission: EM | Admit: 2022-05-28 | Discharge: 2022-05-28 | Discharge status: Against Medical Advice | Payer: 59 | Attending: Emergency Medicine | Admitting: Emergency Medicine

## 2022-05-28 ENCOUNTER — Other Ambulatory Visit: Payer: Self-pay

## 2022-05-28 ENCOUNTER — Telehealth: Payer: Self-pay | Admitting: Gastroenterology

## 2022-05-28 DIAGNOSIS — R110 Nausea: Secondary | ICD-10-CM | POA: Diagnosis not present

## 2022-05-28 DIAGNOSIS — Z5321 Procedure and treatment not carried out due to patient leaving prior to being seen by health care provider: Secondary | ICD-10-CM | POA: Diagnosis not present

## 2022-05-28 DIAGNOSIS — R1012 Left upper quadrant pain: Secondary | ICD-10-CM | POA: Insufficient documentation

## 2022-05-28 HISTORY — DX: Malignant (primary) neoplasm, unspecified: C80.1

## 2022-05-28 LAB — CBC WITH DIFFERENTIAL/PLATELET
Abs Immature Granulocytes: 0.06 10*3/uL (ref 0.00–0.07)
Basophils Absolute: 0.1 10*3/uL (ref 0.0–0.1)
Basophils Relative: 1 %
Eosinophils Absolute: 0.1 10*3/uL (ref 0.0–0.5)
Eosinophils Relative: 1 %
HCT: 47.2 % — ABNORMAL HIGH (ref 36.0–46.0)
Hemoglobin: 14.4 g/dL (ref 12.0–15.0)
Immature Granulocytes: 1 %
Lymphocytes Relative: 34 %
Lymphs Abs: 2.9 10*3/uL (ref 0.7–4.0)
MCH: 30.4 pg (ref 26.0–34.0)
MCHC: 30.5 g/dL (ref 30.0–36.0)
MCV: 99.8 fL (ref 80.0–100.0)
Monocytes Absolute: 0.7 10*3/uL (ref 0.1–1.0)
Monocytes Relative: 9 %
Neutro Abs: 4.5 10*3/uL (ref 1.7–7.7)
Neutrophils Relative %: 54 %
Platelets: 296 10*3/uL (ref 150–400)
RBC: 4.73 MIL/uL (ref 3.87–5.11)
RDW: 12.2 % (ref 11.5–15.5)
WBC: 8.3 10*3/uL (ref 4.0–10.5)
nRBC: 0 % (ref 0.0–0.2)

## 2022-05-28 LAB — COMPREHENSIVE METABOLIC PANEL
ALT: 29 U/L (ref 0–44)
AST: 30 U/L (ref 15–41)
Albumin: 4.3 g/dL (ref 3.5–5.0)
Alkaline Phosphatase: 35 U/L — ABNORMAL LOW (ref 38–126)
Anion gap: 8 (ref 5–15)
BUN: 12 mg/dL (ref 6–20)
CO2: 22 mmol/L (ref 22–32)
Calcium: 9.2 mg/dL (ref 8.9–10.3)
Chloride: 105 mmol/L (ref 98–111)
Creatinine, Ser: 0.74 mg/dL (ref 0.44–1.00)
GFR, Estimated: >60 mL/min (ref >60)
Glucose, Bld: 85 mg/dL (ref 70–99)
Potassium: 4.3 mmol/L (ref 3.5–5.1)
Sodium: 135 mmol/L (ref 135–145)
Total Bilirubin: 1 mg/dL (ref 0.3–1.2)
Total Protein: 7.5 g/dL (ref 6.5–8.1)

## 2022-05-28 LAB — LIPASE, BLOOD: Lipase: 42 U/L (ref 11–51)

## 2022-05-28 LAB — I-STAT BETA HCG BLOOD, ED (MC, WL, AP ONLY): I-stat hCG, quantitative: <5 m[IU]/mL (ref <5)

## 2022-05-28 MED ORDER — ONDANSETRON 4 MG PO TBDP
4.0000 mg | ORAL_TABLET | Freq: Once | ORAL | Status: AC
  Administered 2022-05-28: 4 mg via ORAL
  Filled 2022-05-28: qty 1

## 2022-05-28 NOTE — Telephone Encounter (Signed)
The pt's mother called to state that the pt is experiencing severe left side abd pain, nausea and vomiting.     See below the message from 10/17.    I have advised that she needs to go to the ED for eval due to the severity of the pain and her history.   "mucinous cystic neoplasm.  We now also have in the fluid itself neoplastic appearing cells.  The next steps in her evaluation will be for follow-up with Dr. Zenia Resides to discuss the consideration of distal pancreatectomy for removal of this pancreatic cyst and the distal pancreas."

## 2022-05-28 NOTE — Telephone Encounter (Signed)
Inbound call from patient mom stating patient is in class at the moment in pain. Patient mom is requesting someone to give her a call back since patient is in class . Please advise.  Thank you

## 2022-05-28 NOTE — ED Triage Notes (Addendum)
Patient c/o left upper abdominal pain and nausea that started today.  Patient reports a recent  EGD with a biopsy. Patient states her biopsy came back positive for cancer.

## 2022-05-28 NOTE — Telephone Encounter (Signed)
Noted  

## 2022-05-28 NOTE — Telephone Encounter (Signed)
Patty, Thank you for letting me know. I actually called and spoke with the mom this morning to and the daughter (patient) had just arrived home. She has had this acute onset of pain starting earlier today after eating breakfast that has just become constant and progressive and is causing her to have a shortness of breath sensation as well. I agree with the plan of action of evaluation in the emergency department and I have recommended she come to [facility] or [facility] or come to [facility] to be further evaluated. This far out it would be hard to imagine she had pancreatitis develop from our EUS more than 2 weeks out but certainly need to keep that in mind the other thing would be infectious risk but she completed antibiotics and had not had any issues when we had spoken with her earlier. Laboratories and imaging make the most sense in her further evaluation. Please check in with her next week to see where things stand. Thanks. GM  FYI SA in regards to mutual patient in case it causes issues with your follow-up in clinic next week.

## 2022-05-28 NOTE — ED Notes (Signed)
Pt sts she is feeling a lot better than she did when she arrived here. Pt is following up with her surgeon next week but is going home now.

## 2022-05-28 NOTE — ED Provider Triage Note (Cosign Needed Addendum)
Emergency Medicine Provider Triage Evaluation Note  Armona , a 21 y.o. female  was evaluated in triage.  Pt complains of sudden onset of left upper quadrant abdominal pain that is gradually worsened throughout the day today.  Started this morning, try to go to school, noticed the pain continued to worsen.  Pain described as achy and throbbing, and causing her to feel nauseous however has not vomited.  Denies fever, urinary symptoms, or changes in bowel habits.  Recent EGD on 10/9 of suspicious mass on pancreatic tail, biopsy came back positive for malignancy.  Denies recent trauma or injury.  Review of Systems  Positive:  Negative: See above  Physical Exam  BP (!) 155/116   Pulse (!) 57   Temp 98.3 F (36.8 C) (Oral)   Resp 18   Ht '5\' 6"'$  (1.676 m)   Wt 117 kg   LMP 05/26/2022 Comment: HCG sent  SpO2 100%   BMI 41.64 kg/m  Gen:   Awake, no distress   Resp:  Normal effort, equal chest rise MSK:   Moves extremities without difficulty  Other:  LUQ tenderness, abdomen soft, protuberant  Medical Decision Making  Medically screening exam initiated at 1:22 PM.  Appropriate orders placed.  Breeona P Goguen was informed that the remainder of the evaluation will be completed by another provider, this initial triage assessment does not replace that evaluation, and the importance of remaining in the ED until their evaluation is complete.     Prince Rome, PA-C 71/16/57 [id number]    Prince Rome, PA-C 33/38/32 [id number]

## 2022-06-01 ENCOUNTER — Telehealth: Payer: Self-pay | Admitting: Gastroenterology

## 2022-06-01 NOTE — Telephone Encounter (Signed)
[facility] laboratories  Updated and finalized report  CEA 13,460 ng/mL Amylase 20 units/L Glucose less than 4.3 mg/dL  Cytology = NO MALIGNANT CELLS (NO NEOPLASM)  DNA quantity/quality = low quantity/poor quality (degraded)  Oncogene point mutations GNAS = no mutation KRAS = no mutation  Tumor suppressor gene (LOH) No LOH detected   The findings today were in stark contrast to the previous cytopathology that showed an documented neoplastic cells.  I immediately called [facility] and discussed this case with their pathologist.  Unfortunately what was sent and documented was not correct previously.  There is no evidence of any malignant cells or premalignant cells in any of the fluid studies.  They were apologetic for this mistake and will be rectifying this with updated cytopathology report as well as an updated final [facility] report.  This should be faxed within the next couple of days to us and we will document this and recorded into the chart and send copies for the patient's records as well.  I called and discussed this with both the patient and patient's mom and apologize for this incorrect finding initially that had been sent to us.  They were very appreciative for the call back and are quite thankful that there is no evidence of any cancer cells at this time.  They still understand that the cyst looks to be a premalignant appearing cyst based on fluid analysis and may still require potential consideration for resection but not as urgently versus very close monitoring and surveillance.  They have an upcoming clinic visit with Dr. Allen later this week.  I actually already called and spoke with Dr. Allen as well so I have updated her about these results.  No further follow-up currently but will see what Dr. Allen and the patient decide in regards to her overall follow-up in the coming weeks.   Gabriel Mansouraty, MD [facility] Office # [phone] 

## 2022-06-02 ENCOUNTER — Ambulatory Visit: Payer: Self-pay | Admitting: Surgery

## 2022-06-02 DIAGNOSIS — K862 Cyst of pancreas: Secondary | ICD-10-CM

## 2022-06-02 NOTE — Telephone Encounter (Signed)
Sent to PCP as ordered

## 2022-06-02 NOTE — H&P (Signed)
History of Present Illness: Kiara Harris is a 21 y.o. female who is seen today for follow up of a cystic mass in the tail of the pancreas. This was incidentally diagnosed in December 2022 on a lumbar spine MRI. Since her last visit with me, she recently had an EUS by Dr. Rush Landmark on 05/17/22, which showed a 4.4cm cystic mass in the tail of the pancreas with many septations. FNA showed inflammatory cells but no malignant cells. Cyst fluid studies were as follows:   CEA 13,450 ng/ml Amylase 20 U/L   She is here today to discuss the next steps. She had some abdominal pain a few days following the EUS and went to the ED. Labs, including lipase, were unremarkable, but she left before being fully evaluated or obtaining imaging. She says she still has some back pain but overall feels better.    Review of Systems: A complete review of systems was obtained from the patient.  I have reviewed this information and discussed as appropriate with the patient.  See HPI as well for other ROS.       Medical History: Past Medical History History reviewed. No pertinent past medical history.    Patient Active Problem List Diagnosis  Pancreatic cyst     Past Surgical History History reviewed. No pertinent surgical history.     Allergies Allergies Allergen Reactions  Amoxicillin Rash      No current outpatient medications on file prior to visit.   No current facility-administered medications on file prior to visit.     Family History Family History Problem Relation Age of Onset  Obesity Mother    Obesity Father    Stroke Maternal Grandfather    Hyperlipidemia (Elevated cholesterol) Maternal Grandfather    High blood pressure (Hypertension) Maternal Grandfather    Coronary Artery Disease (Blocked arteries around heart) Maternal Grandfather        Social History   Tobacco Use Smoking Status Never Smokeless Tobacco Not on file     Social HistoryExpand by Default Social History     Socioeconomic History  Marital status: Single Tobacco Use  Smoking status: Never Vaping Use  Vaping Use: Every day Substance and Sexual Activity  Alcohol use: Yes     Comment: occasionally  Drug use: Never      Objective:     Vitals:   06/02/22 1452 BP: (!) 140/100 Pulse: 89 Temp: 36.1 C (97 F) SpO2: 96% Weight: (!) 120.7 kg (266 lb) Height: 167.6 cm ('5\' 6"'$ )   Body mass index is 42.93 kg/m.   Physical Exam Vitals reviewed.  Constitutional:      General: She is not in acute distress.    Appearance: Normal appearance.  HENT:     Head: Normocephalic and atraumatic.  Eyes:     General: No scleral icterus.    Conjunctiva/sclera: Conjunctivae normal.  Pulmonary:     Effort: Pulmonary effort is normal. No respiratory distress.  Abdominal:     General: There is no distension.     Palpations: Abdomen is soft.  Skin:    General: Skin is warm and dry.     Coloration: Skin is not jaundiced.  Neurological:     General: No focal deficit present.     Mental Status: She is alert and oriented to person, place, and time.  Psychiatric:        Mood and Affect: Mood normal.        Behavior: Behavior normal.        Thought  Content: Thought content normal.              Assessment and Plan:    Diagnoses and all orders for this visit:   Benign cystic mucinous tumor     This is a 21 yo female with a cystic mass of the tail of the pancreas. EUS with fluid studies are consistent with a mucinous cystic neoplasm, which is consistent with the imaging findings. I discussed with the patient and her mother today that although there is no sign of malignancy currently, this lesion does have a risk of malignant transformation and resection is recommended, particularly in this very young patient, to minimize risk of malignancy over time. Resection was recommended via laparoscopic distal pancreatectomy with splenectomy. The cyst abuts the splenic hilum so I feel that splenic  preservation will be very difficult. The details of this procedure were discussed, including the benefits and the risks of bleeding, infection, damage to surrounding structures, 20% risk of pancreatic fistula, and risk of post-splenectomy sepsis. Patient was provided with a script for post-splenectomy vaccines and given instructions for getting the first series of vaccines prior to surgery. All questions were answered and the patient agrees to proceed with surgery. She is in school and has winter break in December for about 3 weeks. We will plan to schedule her surgery at the beginning of her break to minimize time missed from school.   She has had some pain after the procedure but lipase was normal in the ED, and she is feeling better. I will do a phone follow up with her again prior to surgery in about 1 month.  She and her mother were encouraged to call if they have any further questions or concerns prior to surgery.  Michaelle Birks, MD [facility] General, Hepatobiliary and Pancreatic Surgery 06/02/22 5:18 PM

## 2022-07-06 ENCOUNTER — Encounter: Payer: Self-pay | Admitting: Gastroenterology

## 2022-07-12 NOTE — Progress Notes (Signed)
Surgical Instructions    Your procedure is scheduled on Thursday December 14th.  Report to [facility] Main Entrance "A" at 5:30 A.M., then check in with the Admitting office.  Call this number if you have problems the morning of surgery:  [phone]   If you have any questions prior to your surgery date call [phone]: Open Monday-Friday 8am-4pm If you experience any cold or flu symptoms such as cough, fever, chills, shortness of breath, etc. between now and your scheduled surgery, please notify us at the above number     Remember:  Do not eat after midnight the night before your surgery  You may drink clear liquids until 4:30am the morning of your surgery.   Clear liquids allowed are: Water, Non-Citrus Juices (without pulp), Carbonated Beverages, Clear Tea, Black Coffee ONLY (NO MILK, CREAM OR POWDERED CREAMER of any kind), and Gatorade   Enhanced Recovery after Surgery  Enhanced Recovery after Surgery is a protocol used to improve the stress on your body and your recovery after surgery.  Patient Instructions  The day of surgery (if you do NOT have diabetes):  * Drink TWO (2) Pre-surgery clear Ensures by 5:30 pm the evening before surgery. Drink ONE (1) Pre-Surgery Clear Ensure by __4:30___ am the morning of surgery   This drink was given to you during your hospital  pre-op appointment visit. Nothing else to drink after completing the  Pre-Surgery Clear Ensure.          If you have questions, please contact your surgeon's office.     Take these medicines the morning of surgery with A SIP OF WATER:  IF NEEDED  acetaminophen (TYLENOL) 325 MG tablet  albuterol (VENTOLIN HFA) 108 (90 Base) MCG/ACT inhaler - bring with you to the hospital buPROPion ER (WELLBUTRIN SR) 100 MG 12 hr tablet  dicyclomine (BENTYL) 20 MG tablet  ondansetron (ZOFRAN-ODT) 4 MG disintegrating tablet  pantoprazole (PROTONIX) 40 MG tablet   As of today, STOP taking any Aspirin (unless otherwise  instructed by your surgeon) MOBIC, Aleve, Naproxen, Ibuprofen, Motrin, Advil, Goody's, BC's, all herbal medications, fish oil, and all vitamins.           Do not wear jewelry or makeup. Do not wear lotions, powders, perfumes or deodorant. Do not shave 48 hours prior to surgery.   Do not bring valuables to the hospital. Do not wear nail polish, gel polish, artificial nails, or any other type of covering on natural nails (fingers and toes) If you have artificial nails or gel coating that need to be removed by a nail salon, please have this removed prior to surgery. Artificial nails or gel coating may interfere with anesthesia's ability to adequately monitor your vital signs.  [facility] is not responsible for any belongings or valuables.    Do NOT Smoke (Tobacco/Vaping)  24 hours prior to your procedure  If you use a CPAP at night, you may bring your mask for your overnight stay.   Contacts, glasses, hearing aids, dentures or partials may not be worn into surgery, please bring cases for these belongings   For patients admitted to the hospital, discharge time will be determined by your treatment team.   Patients discharged the day of surgery will not be allowed to drive home, and someone needs to stay with them for 24 hours.   SURGICAL WAITING ROOM VISITATION Patients having surgery or a procedure may have no more than 2 support people in the waiting area - these visitors may rotate.  Children under the age of 44 must have an adult with them who is not the patient. If the patient needs to stay at the hospital during part of their recovery, the visitor guidelines for inpatient rooms apply. Pre-op nurse will coordinate an appropriate time for 1 support person to accompany patient in pre-op.  This support person may not rotate.   Please refer to [email or web address].hu for the visitor guidelines for Inpatients (after your surgery is over and you  are in a regular room).    Special instructions:    Oral Hygiene is also important to reduce your risk of infection.  Remember - BRUSH YOUR TEETH THE MORNING OF SURGERY WITH YOUR REGULAR TOOTHPASTE   [facility]- Preparing For Surgery  Before surgery, you can play an important role. Because skin is not sterile, your skin needs to be as free of germs as possible. You can reduce the number of germs on your skin by washing with CHG (chlorahexidine gluconate) Soap before surgery.  CHG is an antiseptic cleaner which kills germs and bonds with the skin to continue killing germs even after washing.     Please do not use if you have an allergy to CHG or antibacterial soaps. If your skin becomes reddened/irritated stop using the CHG.  Do not shave (including legs and underarms) for at least 48 hours prior to first CHG shower. It is OK to shave your face.  Please follow these instructions carefully.     Shower the NIGHT BEFORE SURGERY and the MORNING OF SURGERY with CHG Soap.   If you chose to wash your hair, wash your hair first as usual with your normal shampoo. After you shampoo, rinse your hair and body thoroughly to remove the shampoo.  Then [employer] and genitals (private parts) with your normal soap and rinse thoroughly to remove soap.  After that Use CHG Soap as you would any other liquid soap. You can apply CHG directly to the skin and wash gently with a scrungie or a clean washcloth.   Apply the CHG Soap to your body ONLY FROM THE NECK DOWN.  Do not use on open wounds or open sores. Avoid contact with your eyes, ears, mouth and genitals (private parts). Wash Face and genitals (private parts)  with your normal soap.   Wash thoroughly, paying special attention to the area where your surgery will be performed.  Thoroughly rinse your body with warm water from the neck down.  DO NOT shower/wash with your normal soap after using and rinsing off the CHG Soap.  Pat yourself dry with a CLEAN  TOWEL.  Wear CLEAN PAJAMAS to bed the night before surgery  Place CLEAN SHEETS on your bed the night before your surgery  DO NOT SLEEP WITH PETS.   Day of Surgery:  Take a shower with CHG soap. Wear Clean/Comfortable clothing the morning of surgery Do not apply any deodorants/lotions.   Remember to brush your teeth WITH YOUR REGULAR TOOTHPASTE.    If you received a COVID test during your pre-op visit, it is requested that you wear a mask when out in public, stay away from anyone that may not be feeling well, and notify your surgeon if you develop symptoms. If you have been in contact with anyone that has tested positive in the last 10 days, please notify your surgeon.    Please read over the following fact sheets that you were given.

## 2022-07-13 ENCOUNTER — Encounter (HOSPITAL_COMMUNITY): Payer: Self-pay

## 2022-07-13 ENCOUNTER — Other Ambulatory Visit: Payer: Self-pay

## 2022-07-13 ENCOUNTER — Encounter (HOSPITAL_COMMUNITY)
Admission: RE | Admit: 2022-07-13 | Discharge: 2022-07-13 | Disposition: A | Discharge status: Home / Self Care | Payer: 59 | Source: Ambulatory Visit | Attending: Surgery | Admitting: Surgery

## 2022-07-13 VITALS — BP 142/84 | HR 107 | Temp 97.5°F | Resp 20 | Ht 66.0 in | Wt 267.9 lb

## 2022-07-13 DIAGNOSIS — Z01812 Encounter for preprocedural laboratory examination: Secondary | ICD-10-CM | POA: Diagnosis not present

## 2022-07-13 DIAGNOSIS — K862 Cyst of pancreas: Secondary | ICD-10-CM | POA: Insufficient documentation

## 2022-07-13 DIAGNOSIS — Z01818 Encounter for other preprocedural examination: Secondary | ICD-10-CM

## 2022-07-13 HISTORY — DX: Family history of other specified conditions: Z84.89

## 2022-07-13 HISTORY — DX: Polycystic ovarian syndrome: E28.2

## 2022-07-13 LAB — BASIC METABOLIC PANEL
Anion gap: 7 (ref 5–15)
BUN: 12 mg/dL (ref 6–20)
CO2: 28 mmol/L (ref 22–32)
Calcium: 9.3 mg/dL (ref 8.9–10.3)
Chloride: 103 mmol/L (ref 98–111)
Creatinine, Ser: 0.81 mg/dL (ref 0.44–1.00)
GFR, Estimated: >60 mL/min (ref >60)
Glucose, Bld: 99 mg/dL (ref 70–99)
Potassium: 3.6 mmol/L (ref 3.5–5.1)
Sodium: 138 mmol/L (ref 135–145)

## 2022-07-13 LAB — CBC
HCT: 39.9 % (ref 36.0–46.0)
Hemoglobin: 13.8 g/dL (ref 12.0–15.0)
MCH: 30.3 pg (ref 26.0–34.0)
MCHC: 34.6 g/dL (ref 30.0–36.0)
MCV: 87.5 fL (ref 80.0–100.0)
Platelets: 328 10*3/uL (ref 150–400)
RBC: 4.56 MIL/uL (ref 3.87–5.11)
RDW: 12 % (ref 11.5–15.5)
WBC: 17.4 10*3/uL — ABNORMAL HIGH (ref 4.0–10.5)
nRBC: 0 % (ref 0.0–0.2)

## 2022-07-13 NOTE — Progress Notes (Signed)
PCP - Terrill Mohr MD Cardiologist - Denies seeing anyone currently Did see someone for flutter Patwardhan, Reynold Bowen, MD   PPM/ICD - Denies Device Orders -  Rep Notified -   Chest x-ray - NI EKG - NI Stress Test - Denies ECHO - 09/30/20 Cardiac Cath - Denies  Sleep Study - Denies  DM - Denies   ERAS Protcol - Yes PRE-SURGERY Ensure   COVID TEST- NI   Anesthesia review: No  Patient denies shortness of breath, fever, cough and chest pain at PAT appointment   All instructions explained to the patient, with a verbal understanding of the material. Patient agrees to go over the instructions while at home for a better understanding. The opportunity to ask questions was provided.

## 2022-07-13 NOTE — Progress Notes (Signed)
Ms Kiara Harris 's WBC is 17.4

## 2022-07-14 ENCOUNTER — Other Ambulatory Visit (HOSPITAL_COMMUNITY): Payer: 59

## 2022-07-14 NOTE — Progress Notes (Addendum)
Elmyra Ricks, RN from Dr. Ayesha Rumpf office made aware of the elevated WBC 17.4 from her pre-op appointment on 12/5. Will send chart to anesthesia.

## 2022-07-21 ENCOUNTER — Ambulatory Visit (INDEPENDENT_AMBULATORY_CARE_PROVIDER_SITE_OTHER): Payer: 59 | Admitting: Radiology

## 2022-07-21 ENCOUNTER — Other Ambulatory Visit (HOSPITAL_COMMUNITY): Admission: RE | Admit: 2022-07-21 | Payer: 59 | Source: Home / Self Care | Admitting: Surgery

## 2022-07-21 ENCOUNTER — Encounter: Payer: Self-pay | Admitting: Radiology

## 2022-07-21 ENCOUNTER — Other Ambulatory Visit (HOSPITAL_COMMUNITY)
Admission: RE | Admit: 2022-07-21 | Discharge: 2022-07-21 | Disposition: A | Discharge status: Home / Self Care | Payer: 59 | Source: Ambulatory Visit | Attending: Radiology | Admitting: Radiology

## 2022-07-21 VITALS — BP 124/70 | Ht 65.5 in | Wt 271.0 lb

## 2022-07-21 DIAGNOSIS — Z30011 Encounter for initial prescription of contraceptive pills: Secondary | ICD-10-CM

## 2022-07-21 DIAGNOSIS — Z01419 Encounter for gynecological examination (general) (routine) without abnormal findings: Secondary | ICD-10-CM | POA: Insufficient documentation

## 2022-07-21 MED ORDER — PHEXXI 1.8-1-0.4 % VA GEL: 5.0000 g | VAGINAL | 11 refills | Status: DC

## 2022-07-21 NOTE — Progress Notes (Signed)
   [place] [birth year] [id number]   History:  21 y.o. G0 presents for annual exam. Stopped OCPs because she did not feel well on them, would like another nonhormonal method. Having a splenectomy and partial pancreas removal tomorrow due to cyst of pancreas.  Gynecologic History Patient's last menstrual period was 06/25/2022 (exact date). Period Pattern: (!) Irregular (irregular length and irregular period cyc les) Menstrual Flow: Heavy Menstrual Control: Tampon Dysmenorrhea: (!) Severe Dysmenorrhea Symptoms: Cramping Contraception/Family planning: condoms Sexually active: yes  Obstetric History OB History  Gravida Para Term Preterm AB Living  0 0 0 0 0 0  SAB IAB Ectopic Multiple Live Births  0 0 0 0 0     The following portions of the patient's history were reviewed and updated as appropriate: allergies, current medications, past family history, past medical history, past social history, past surgical history, and problem list.  Review of Systems Pertinent items noted in HPI and remainder of comprehensive ROS otherwise negative.   Past medical history, past surgical history, family history and social history were all reviewed and documented in the EPIC chart.   Exam:  Vitals:   07/21/22 1442  BP: 124/70  Weight: 271 lb (122.9 kg)  Height: 5' 5.5" (1.664 m)   Body mass index is 44.41 kg/m.  General appearance:  Normal Thyroid:  Symmetrical, normal in size, without palpable masses or nodularity. Respiratory  Auscultation:  Clear without wheezing or rhonchi Cardiovascular  Auscultation:  Regular rate, without rubs, murmurs or gallops  Edema/varicosities:  Not grossly evident Abdominal  Soft,nontender, without masses, guarding or rebound.  Liver/spleen:  No organomegaly noted  Hernia:  None appreciated  Skin  Inspection:  Grossly normal Breasts: Examined lying and sitting.   Right: Without masses, retractions, nipple discharge or axillary  adenopathy.   Left: Without masses, retractions, nipple discharge or axillary adenopathy. Genitourinary   Inguinal/mons:  Normal without inguinal adenopathy  External genitalia:  Normal appearing vulva with no masses, tenderness, or lesions  BUS/Urethra/Skene's glands:  Normal without masses or exudate  Vagina:  Normal appearing with normal color and discharge, no lesions  Cervix:  Normal appearing without discharge or lesions  Uterus:  Normal in size, shape and contour.  Mobile, nontender  Adnexa/parametria:     Rt: Normal in size, without masses or tenderness.   Lt: Normal in size, without masses or tenderness.  Anus and perineum: Normal   Patient informed chaperone available to be present for breast and pelvic exam. Patient has requested no chaperone to be present. Patient has been advised what will be completed during breast and pelvic exam.   Assessment/Plan:   1. OCP (oral contraceptive pills) initiation - Lactic Ac-Citric Ac-Pot Bitart (PHEXXI) 1.8-1-0.4 % GEL; Place 5 g vaginally as directed. Up to 1 hour before intercourse  Dispense: 120 g; Refill: 11  2. Well woman exam with routine gynecological exam - Cytology - PAP( [place])     Discussed SBE, colonoscopy and DEXA screening as directed/appropriate. Recommend 190mns of exercise weekly, including weight bearing exercise. Encouraged the use of seatbelts and sunscreen. Return in 1 year for annual or as needed.   CRubbie BattiestB WHNP-BC 3:14 PM 07/21/2022

## 2022-07-22 ENCOUNTER — Other Ambulatory Visit: Payer: Self-pay

## 2022-07-22 ENCOUNTER — Inpatient Hospital Stay (HOSPITAL_COMMUNITY): Payer: 59 | Admitting: Physician Assistant

## 2022-07-22 ENCOUNTER — Encounter (HOSPITAL_COMMUNITY): Payer: Self-pay | Admitting: Surgery

## 2022-07-22 ENCOUNTER — Encounter (HOSPITAL_COMMUNITY)
Admission: RE | Disposition: A | Discharge status: Home / Self Care | Payer: Self-pay | Source: Home / Self Care | Attending: Surgery

## 2022-07-22 ENCOUNTER — Inpatient Hospital Stay (HOSPITAL_COMMUNITY)
Admission: RE | Admit: 2022-07-22 | Discharge: 2022-07-27 | DRG: 406 | Disposition: A | Discharge status: Home / Self Care | Payer: 59 | Attending: Surgery | Admitting: Surgery

## 2022-07-22 DIAGNOSIS — K862 Cyst of pancreas: Secondary | ICD-10-CM | POA: Diagnosis present

## 2022-07-22 DIAGNOSIS — D136 Benign neoplasm of pancreas: Secondary | ICD-10-CM | POA: Diagnosis not present

## 2022-07-22 DIAGNOSIS — F419 Anxiety disorder, unspecified: Secondary | ICD-10-CM | POA: Diagnosis present

## 2022-07-22 DIAGNOSIS — Z91041 Radiographic dye allergy status: Secondary | ICD-10-CM

## 2022-07-22 DIAGNOSIS — Z8249 Family history of ischemic heart disease and other diseases of the circulatory system: Secondary | ICD-10-CM

## 2022-07-22 DIAGNOSIS — Z87891 Personal history of nicotine dependence: Secondary | ICD-10-CM | POA: Diagnosis not present

## 2022-07-22 DIAGNOSIS — K219 Gastro-esophageal reflux disease without esophagitis: Secondary | ICD-10-CM | POA: Diagnosis present

## 2022-07-22 DIAGNOSIS — Z881 Allergy status to other antibiotic agents status: Secondary | ICD-10-CM | POA: Diagnosis not present

## 2022-07-22 DIAGNOSIS — Z91040 Latex allergy status: Secondary | ICD-10-CM | POA: Diagnosis not present

## 2022-07-22 DIAGNOSIS — M25512 Pain in left shoulder: Secondary | ICD-10-CM | POA: Diagnosis not present

## 2022-07-22 DIAGNOSIS — E282 Polycystic ovarian syndrome: Secondary | ICD-10-CM | POA: Diagnosis present

## 2022-07-22 DIAGNOSIS — Z880 Allergy status to penicillin: Secondary | ICD-10-CM | POA: Diagnosis not present

## 2022-07-22 DIAGNOSIS — J45909 Unspecified asthma, uncomplicated: Secondary | ICD-10-CM | POA: Diagnosis not present

## 2022-07-22 DIAGNOSIS — Z800 Family history of malignant neoplasm of digestive organs: Secondary | ICD-10-CM | POA: Diagnosis not present

## 2022-07-22 DIAGNOSIS — Z23 Encounter for immunization: Secondary | ICD-10-CM

## 2022-07-22 DIAGNOSIS — Z888 Allergy status to other drugs, medicaments and biological substances status: Secondary | ICD-10-CM | POA: Diagnosis not present

## 2022-07-22 DIAGNOSIS — Z6841 Body Mass Index (BMI) 40.0 and over, adult: Secondary | ICD-10-CM

## 2022-07-22 DIAGNOSIS — C252 Malignant neoplasm of tail of pancreas: Principal | ICD-10-CM | POA: Diagnosis present

## 2022-07-22 DIAGNOSIS — F32A Depression, unspecified: Secondary | ICD-10-CM | POA: Diagnosis present

## 2022-07-22 DIAGNOSIS — E669 Obesity, unspecified: Secondary | ICD-10-CM | POA: Diagnosis present

## 2022-07-22 DIAGNOSIS — F418 Other specified anxiety disorders: Secondary | ICD-10-CM

## 2022-07-22 HISTORY — PX: SPLENECTOMY, TOTAL: SHX788

## 2022-07-22 HISTORY — PX: PANCREATECTOMY: SHX5261

## 2022-07-22 LAB — GLUCOSE, CAPILLARY: Glucose-Capillary: 126 mg/dL — ABNORMAL HIGH (ref 70–99)

## 2022-07-22 LAB — AMYLASE: Amylase: 90 U/L (ref 28–100)

## 2022-07-22 LAB — BASIC METABOLIC PANEL
Anion gap: 10 (ref 5–15)
BUN: 11 mg/dL (ref 6–20)
CO2: 24 mmol/L (ref 22–32)
Calcium: 9.4 mg/dL (ref 8.9–10.3)
Chloride: 105 mmol/L (ref 98–111)
Creatinine, Ser: 0.78 mg/dL (ref 0.44–1.00)
GFR, Estimated: >60 mL/min (ref >60)
Glucose, Bld: 124 mg/dL — ABNORMAL HIGH (ref 70–99)
Potassium: 4 mmol/L (ref 3.5–5.1)
Sodium: 139 mmol/L (ref 135–145)

## 2022-07-22 LAB — CBC
HCT: 43.7 % (ref 36.0–46.0)
Hemoglobin: 14.8 g/dL (ref 12.0–15.0)
MCH: 29.9 pg (ref 26.0–34.0)
MCHC: 33.9 g/dL (ref 30.0–36.0)
MCV: 88.3 fL (ref 80.0–100.0)
Platelets: 381 10*3/uL (ref 150–400)
RBC: 4.95 MIL/uL (ref 3.87–5.11)
RDW: 12.3 % (ref 11.5–15.5)
WBC: 44.7 10*3/uL — ABNORMAL HIGH (ref 4.0–10.5)
nRBC: 0 % (ref 0.0–0.2)

## 2022-07-22 LAB — PREPARE RBC (CROSSMATCH)

## 2022-07-22 LAB — ABO/RH: ABO/RH(D): A POS

## 2022-07-22 SURGERY — PANCREATECTOMY, LAPAROSCOPIC
Anesthesia: General

## 2022-07-22 MED ORDER — CELECOXIB 200 MG PO CAPS
400.0000 mg | ORAL_CAPSULE | ORAL | Status: AC
  Administered 2022-07-22: 400 mg via ORAL
  Filled 2022-07-22: qty 2

## 2022-07-22 MED ORDER — DEXAMETHASONE SODIUM PHOSPHATE 10 MG/ML IJ SOLN
INTRAMUSCULAR | Status: DC | PRN
  Administered 2022-07-22: 10 mg via INTRAVENOUS

## 2022-07-22 MED ORDER — ORAL CARE MOUTH RINSE: 15.0000 mL | Freq: Once | OROMUCOSAL | Status: AC

## 2022-07-22 MED ORDER — FENTANYL CITRATE (PF) 250 MCG/5ML IJ SOLN
INTRAMUSCULAR | Status: DC | PRN
  Administered 2022-07-22 (×2): 50 ug via INTRAVENOUS
  Administered 2022-07-22: 100 ug via INTRAVENOUS
  Administered 2022-07-22: 50 ug via INTRAVENOUS

## 2022-07-22 MED ORDER — ACETAMINOPHEN 325 MG PO TABS: 325.0000 mg | ORAL_TABLET | ORAL | Status: DC | PRN

## 2022-07-22 MED ORDER — FENTANYL CITRATE (PF) 100 MCG/2ML IJ SOLN
INTRAMUSCULAR | Status: AC
  Filled 2022-07-22: qty 2

## 2022-07-22 MED ORDER — MIDAZOLAM HCL 2 MG/2ML IJ SOLN
INTRAMUSCULAR | Status: DC | PRN
  Administered 2022-07-22: 2 mg via INTRAVENOUS

## 2022-07-22 MED ORDER — PROPOFOL 500 MG/50ML IV EMUL
INTRAVENOUS | Status: DC | PRN
  Administered 2022-07-22: 20 ug/kg/min via INTRAVENOUS

## 2022-07-22 MED ORDER — PROMETHAZINE HCL 25 MG/ML IJ SOLN: 6.2500 mg | INTRAMUSCULAR | Status: DC | PRN

## 2022-07-22 MED ORDER — POVIDONE-IODINE 10 % EX OINT
TOPICAL_OINTMENT | CUTANEOUS | Status: AC
  Filled 2022-07-22: qty 28.35

## 2022-07-22 MED ORDER — ONDANSETRON HCL 4 MG/2ML IJ SOLN
INTRAMUSCULAR | Status: DC | PRN
  Administered 2022-07-22: 4 mg via INTRAVENOUS

## 2022-07-22 MED ORDER — KETOROLAC TROMETHAMINE 15 MG/ML IJ SOLN
15.0000 mg | Freq: Three times a day (TID) | INTRAMUSCULAR | Status: DC
  Administered 2022-07-22 – 2022-07-25 (×10): 15 mg via INTRAVENOUS
  Filled 2022-07-22 (×9): qty 1

## 2022-07-22 MED ORDER — ACETAMINOPHEN 500 MG PO TABS
1000.0000 mg | ORAL_TABLET | ORAL | Status: AC
  Administered 2022-07-22: 1000 mg via ORAL
  Filled 2022-07-22: qty 2

## 2022-07-22 MED ORDER — LACTATED RINGERS IV SOLN: INTRAVENOUS | Status: DC | PRN

## 2022-07-22 MED ORDER — CEFAZOLIN IN SODIUM CHLORIDE 3-0.9 GM/100ML-% IV SOLN
3.0000 g | INTRAVENOUS | Status: AC
  Administered 2022-07-22: 3 g via INTRAVENOUS
  Filled 2022-07-22: qty 100

## 2022-07-22 MED ORDER — CHLORHEXIDINE GLUCONATE 0.12 % MT SOLN
15.0000 mL | Freq: Once | OROMUCOSAL | Status: AC
  Administered 2022-07-22: 15 mL via OROMUCOSAL
  Filled 2022-07-22: qty 15

## 2022-07-22 MED ORDER — SODIUM CHLORIDE 0.9 % IR SOLN
Status: DC | PRN
  Administered 2022-07-22: 1000 mL

## 2022-07-22 MED ORDER — KETOROLAC TROMETHAMINE 15 MG/ML IJ SOLN
INTRAMUSCULAR | Status: AC
  Administered 2022-07-22: 15 mg
  Filled 2022-07-22: qty 1

## 2022-07-22 MED ORDER — ACETAMINOPHEN 160 MG/5ML PO SOLN: 325.0000 mg | ORAL | Status: DC | PRN

## 2022-07-22 MED ORDER — ACETAMINOPHEN 10 MG/ML IV SOLN: 1000.0000 mg | Freq: Once | INTRAVENOUS | Status: DC | PRN

## 2022-07-22 MED ORDER — ONDANSETRON HCL 4 MG/2ML IJ SOLN
INTRAMUSCULAR | Status: AC
  Filled 2022-07-22: qty 2

## 2022-07-22 MED ORDER — PHENYLEPHRINE 80 MCG/ML (10ML) SYRINGE FOR IV PUSH (FOR BLOOD PRESSURE SUPPORT)
PREFILLED_SYRINGE | INTRAVENOUS | Status: DC | PRN
  Administered 2022-07-22 (×2): 160 ug via INTRAVENOUS

## 2022-07-22 MED ORDER — SCOPOLAMINE 1 MG/3DAYS TD PT72
1.0000 | MEDICATED_PATCH | TRANSDERMAL | Status: DC
  Administered 2022-07-22: 1 via TRANSDERMAL

## 2022-07-22 MED ORDER — OXYCODONE HCL 5 MG PO TABS
5.0000 mg | ORAL_TABLET | ORAL | Status: DC | PRN
  Administered 2022-07-22 – 2022-07-26 (×16): 10 mg via ORAL
  Filled 2022-07-22 (×15): qty 2

## 2022-07-22 MED ORDER — PROPOFOL 10 MG/ML IV BOLUS
INTRAVENOUS | Status: DC | PRN
  Administered 2022-07-22: 160 mg via INTRAVENOUS

## 2022-07-22 MED ORDER — LACTATED RINGERS IV SOLN: INTRAVENOUS | Status: DC

## 2022-07-22 MED ORDER — SUGAMMADEX SODIUM 200 MG/2ML IV SOLN
INTRAVENOUS | Status: DC | PRN
  Administered 2022-07-22: 200 mg via INTRAVENOUS

## 2022-07-22 MED ORDER — HYDROMORPHONE HCL 1 MG/ML IJ SOLN
INTRAMUSCULAR | Status: DC | PRN
  Administered 2022-07-22: .5 mg via INTRAVENOUS

## 2022-07-22 MED ORDER — AMISULPRIDE (ANTIEMETIC) 5 MG/2ML IV SOLN: 10.0000 mg | Freq: Once | INTRAVENOUS | Status: DC | PRN

## 2022-07-22 MED ORDER — HYDROMORPHONE HCL 1 MG/ML IJ SOLN
0.5000 mg | INTRAMUSCULAR | Status: DC | PRN
  Administered 2022-07-22 (×3): 0.5 mg via INTRAVENOUS

## 2022-07-22 MED ORDER — ONDANSETRON 4 MG PO TBDP: 4.0000 mg | ORAL_TABLET | Freq: Four times a day (QID) | ORAL | Status: DC | PRN

## 2022-07-22 MED ORDER — OXYCODONE HCL 5 MG PO TABS: 5.0000 mg | ORAL_TABLET | Freq: Once | ORAL | Status: DC | PRN

## 2022-07-22 MED ORDER — HYDROMORPHONE HCL 1 MG/ML IJ SOLN
INTRAMUSCULAR | Status: AC
  Filled 2022-07-22: qty 1

## 2022-07-22 MED ORDER — ALBUTEROL SULFATE (2.5 MG/3ML) 0.083% IN NEBU: 2.5000 mg | INHALATION_SOLUTION | Freq: Four times a day (QID) | RESPIRATORY_TRACT | Status: DC | PRN

## 2022-07-22 MED ORDER — DOCUSATE SODIUM 100 MG PO CAPS
100.0000 mg | ORAL_CAPSULE | Freq: Two times a day (BID) | ORAL | Status: DC
  Administered 2022-07-22 – 2022-07-27 (×10): 100 mg via ORAL
  Filled 2022-07-22 (×10): qty 1

## 2022-07-22 MED ORDER — PROPOFOL 10 MG/ML IV BOLUS
INTRAVENOUS | Status: AC
  Filled 2022-07-22: qty 20

## 2022-07-22 MED ORDER — LIDOCAINE IN D5W 4-5 MG/ML-% IV SOLN
1.0000 mg/min | INTRAVENOUS | Status: DC
  Administered 2022-07-22: 1.5 mg/min via INTRAVENOUS
  Filled 2022-07-22: qty 500

## 2022-07-22 MED ORDER — FENTANYL CITRATE (PF) 250 MCG/5ML IJ SOLN
INTRAMUSCULAR | Status: AC
  Filled 2022-07-22: qty 5

## 2022-07-22 MED ORDER — DIPHENHYDRAMINE HCL 25 MG PO CAPS
25.0000 mg | ORAL_CAPSULE | Freq: Four times a day (QID) | ORAL | Status: DC | PRN
  Administered 2022-07-26 – 2022-07-27 (×2): 25 mg via ORAL
  Filled 2022-07-22 (×2): qty 1

## 2022-07-22 MED ORDER — PANTOPRAZOLE SODIUM 40 MG PO TBEC
40.0000 mg | DELAYED_RELEASE_TABLET | Freq: Two times a day (BID) | ORAL | Status: DC | PRN
  Administered 2022-07-25 – 2022-07-26 (×2): 40 mg via ORAL
  Filled 2022-07-22 (×2): qty 1

## 2022-07-22 MED ORDER — ENSURE PRE-SURGERY PO LIQD: 296.0000 mL | Freq: Once | ORAL | Status: DC

## 2022-07-22 MED ORDER — ENSURE PRE-SURGERY PO LIQD: 592.0000 mL | Freq: Once | ORAL | Status: DC

## 2022-07-22 MED ORDER — METRONIDAZOLE 500 MG/100ML IV SOLN
500.0000 mg | INTRAVENOUS | Status: AC
  Administered 2022-07-22: 500 mg via INTRAVENOUS
  Filled 2022-07-22: qty 100

## 2022-07-22 MED ORDER — SODIUM CHLORIDE 0.9 % IV SOLN
INTRAVENOUS | Status: DC | PRN
  Administered 2022-07-22: 40 mL

## 2022-07-22 MED ORDER — ACETAMINOPHEN 500 MG PO TABS
1000.0000 mg | ORAL_TABLET | Freq: Four times a day (QID) | ORAL | Status: DC
  Administered 2022-07-22 – 2022-07-27 (×19): 1000 mg via ORAL
  Filled 2022-07-22 (×19): qty 2

## 2022-07-22 MED ORDER — FENTANYL CITRATE (PF) 100 MCG/2ML IJ SOLN
25.0000 ug | INTRAMUSCULAR | Status: DC | PRN
  Administered 2022-07-22 (×3): 50 ug via INTRAVENOUS

## 2022-07-22 MED ORDER — OXYCODONE HCL 5 MG PO TABS
ORAL_TABLET | ORAL | Status: AC
  Filled 2022-07-22: qty 2

## 2022-07-22 MED ORDER — BUPIVACAINE HCL (PF) 0.25 % IJ SOLN
INTRAMUSCULAR | Status: AC
  Filled 2022-07-22: qty 30

## 2022-07-22 MED ORDER — METHOCARBAMOL 1000 MG/10ML IJ SOLN
1000.0000 mg | Freq: Three times a day (TID) | INTRAVENOUS | Status: DC
  Administered 2022-07-22 – 2022-07-25 (×9): 1000 mg via INTRAVENOUS
  Filled 2022-07-22 (×2): qty 10
  Filled 2022-07-22: qty 1000
  Filled 2022-07-22 (×9): qty 10

## 2022-07-22 MED ORDER — DIPHENHYDRAMINE HCL 50 MG/ML IJ SOLN: 25.0000 mg | Freq: Four times a day (QID) | INTRAMUSCULAR | Status: DC | PRN

## 2022-07-22 MED ORDER — MIDAZOLAM HCL 2 MG/2ML IJ SOLN
INTRAMUSCULAR | Status: AC
  Filled 2022-07-22: qty 2

## 2022-07-22 MED ORDER — HYDROMORPHONE HCL 1 MG/ML IJ SOLN
INTRAMUSCULAR | Status: AC
  Filled 2022-07-22: qty 0.5

## 2022-07-22 MED ORDER — ENOXAPARIN SODIUM 40 MG/0.4ML IJ SOSY
40.0000 mg | PREFILLED_SYRINGE | INTRAMUSCULAR | Status: DC
  Administered 2022-07-23 – 2022-07-27 (×5): 40 mg via SUBCUTANEOUS
  Filled 2022-07-22 (×5): qty 0.4

## 2022-07-22 MED ORDER — 0.9 % SODIUM CHLORIDE (POUR BTL) OPTIME
TOPICAL | Status: DC | PRN
  Administered 2022-07-22 (×2): 1000 mL

## 2022-07-22 MED ORDER — BUPROPION HCL ER (SR) 100 MG PO TB12
100.0000 mg | ORAL_TABLET | Freq: Every day | ORAL | Status: DC
  Administered 2022-07-23 – 2022-07-27 (×5): 100 mg via ORAL
  Filled 2022-07-22 (×5): qty 1

## 2022-07-22 MED ORDER — BUPIVACAINE LIPOSOME 1.3 % IJ SUSP
INTRAMUSCULAR | Status: AC
  Filled 2022-07-22: qty 20

## 2022-07-22 MED ORDER — THROMBIN 20000 UNITS EX KIT
PACK | CUTANEOUS | Status: AC
  Filled 2022-07-22: qty 1

## 2022-07-22 MED ORDER — OXYCODONE HCL 5 MG/5ML PO SOLN: 5.0000 mg | Freq: Once | ORAL | Status: DC | PRN

## 2022-07-22 MED ORDER — PHENYLEPHRINE HCL-NACL 20-0.9 MG/250ML-% IV SOLN
INTRAVENOUS | Status: DC | PRN
  Administered 2022-07-22: 15 ug/min via INTRAVENOUS

## 2022-07-22 MED ORDER — ROCURONIUM BROMIDE 10 MG/ML (PF) SYRINGE
PREFILLED_SYRINGE | INTRAVENOUS | Status: DC | PRN
  Administered 2022-07-22: 60 mg via INTRAVENOUS
  Administered 2022-07-22 (×2): 20 mg via INTRAVENOUS
  Administered 2022-07-22: 40 mg via INTRAVENOUS

## 2022-07-22 MED ORDER — LIDOCAINE 2% (20 MG/ML) 5 ML SYRINGE
INTRAMUSCULAR | Status: DC | PRN
  Administered 2022-07-22: 40 mg via INTRAVENOUS

## 2022-07-22 MED ORDER — ONDANSETRON HCL 4 MG/2ML IJ SOLN
4.0000 mg | Freq: Four times a day (QID) | INTRAMUSCULAR | Status: DC | PRN
  Administered 2022-07-22 – 2022-07-25 (×2): 4 mg via INTRAVENOUS
  Filled 2022-07-22: qty 2

## 2022-07-22 SURGICAL SUPPLY — 113 items
ADH SKN CLS APL DERMABOND .7 (GAUZE/BANDAGES/DRESSINGS) ×1
APL PRP STRL LF DISP 70% ISPRP (MISCELLANEOUS) ×1
APL SRG 38 LTWT LNG FL B (MISCELLANEOUS)
APPLICATOR ARISTA FLEXITIP XL (MISCELLANEOUS) IMPLANT
APPLIER CLIP 5 13 M/L LIGAMAX5 (MISCELLANEOUS) ×1
APR CLP MED LRG 5 ANG JAW (MISCELLANEOUS) ×1
BAG COUNTER SPONGE SURGICOUNT (BAG) ×1 IMPLANT
BAG SPNG CNTER NS LX DISP (BAG) ×1
BIOPATCH RED 1 DISK 7.0 (GAUZE/BANDAGES/DRESSINGS) ×1 IMPLANT
BLADE SURG 10 STRL SS (BLADE) ×1 IMPLANT
CANISTER SUCT 3000ML PPV (MISCELLANEOUS) ×1 IMPLANT
CHLORAPREP W/TINT 26 (MISCELLANEOUS) ×1 IMPLANT
CLIP APPLIE 5 13 M/L LIGAMAX5 (MISCELLANEOUS) IMPLANT
CLIP LIGATING HEMO LOK XL GOLD (MISCELLANEOUS) IMPLANT
CLIP TI MEDIUM 24 (CLIP) IMPLANT
CLIP TI WIDE RED SMALL 24 (CLIP) IMPLANT
COVER SURGICAL LIGHT HANDLE (MISCELLANEOUS) ×1 IMPLANT
DERMABOND ADVANCED .7 DNX12 (GAUZE/BANDAGES/DRESSINGS) ×1 IMPLANT
DRAIN CHANNEL 19F RND (DRAIN) ×1 IMPLANT
DRAPE INCISE IOBAN 66X45 STRL (DRAPES) ×1 IMPLANT
DRAPE WARM FLUID 44X44 (DRAPES) ×1 IMPLANT
DRSG TEGADERM 4X4.5 CHG (GAUZE/BANDAGES/DRESSINGS) ×1 IMPLANT
DRSG TEGADERM 4X4.75 (GAUZE/BANDAGES/DRESSINGS) IMPLANT
ELECT BLADE 6.5 EXT (BLADE) ×1 IMPLANT
ELECT CAUTERY BLADE 6.4 (BLADE) ×1 IMPLANT
ELECT REM PT RETURN 9FT ADLT (ELECTROSURGICAL) ×1
ELECTRODE REM PT RTRN 9FT ADLT (ELECTROSURGICAL) ×1 IMPLANT
EVACUATOR SILICONE 100CC (DRAIN) IMPLANT
GAUZE SPONGE 2X2 8PLY STRL LF (GAUZE/BANDAGES/DRESSINGS) IMPLANT
GAUZE SPONGE 4X4 12PLY STRL (GAUZE/BANDAGES/DRESSINGS) ×1 IMPLANT
GLOVE BIOGEL PI IND STRL 6 (GLOVE) ×1 IMPLANT
GLOVE BIOGEL PI IND STRL 6.5 (GLOVE) IMPLANT
GLOVE BIOGEL PI IND STRL 7.0 (GLOVE) IMPLANT
GLOVE BIOGEL PI IND STRL 8 (GLOVE) ×1 IMPLANT
GLOVE BIOGEL PI MICRO STRL 5.5 (GLOVE) IMPLANT
GLOVE SURG POLY MICRO LF SZ5.5 (GLOVE) ×1 IMPLANT
GLOVE SURG SS PI 7.5 STRL IVOR (GLOVE) IMPLANT
GLOVE SURG UNDER POLY LF SZ6 (GLOVE) ×1 IMPLANT
GOWN STRL REUS W/ TWL LRG LVL3 (GOWN DISPOSABLE) ×3 IMPLANT
GOWN STRL REUS W/ TWL XL LVL3 (GOWN DISPOSABLE) ×1 IMPLANT
GOWN STRL REUS W/TWL LRG LVL3 (GOWN DISPOSABLE) ×4
GOWN STRL REUS W/TWL XL LVL3 (GOWN DISPOSABLE) ×1
HANDLE SUCTION POOLE (INSTRUMENTS) ×1 IMPLANT
HEMOSTAT ARISTA ABSORB 3G PWDR (HEMOSTASIS) IMPLANT
IRRIG SUCT STRYKERFLOW 2 WTIP (MISCELLANEOUS) ×1
IRRIGATION SUCT STRKRFLW 2 WTP (MISCELLANEOUS) IMPLANT
KIT BASIN OR (CUSTOM PROCEDURE TRAY) ×1 IMPLANT
KIT TURNOVER KIT B (KITS) ×1 IMPLANT
L-HOOK LAP DISP 36CM (ELECTROSURGICAL) ×1
LHOOK LAP DISP 36CM (ELECTROSURGICAL) IMPLANT
NDL 22X1.5 STRL (OR ONLY) (MISCELLANEOUS) ×1 IMPLANT
NEEDLE 22X1.5 STRL (OR ONLY) (MISCELLANEOUS) ×1 IMPLANT
NS IRRIG 1000ML POUR BTL (IV SOLUTION) ×2 IMPLANT
PAD ARMBOARD 7.5X6 YLW CONV (MISCELLANEOUS) ×2 IMPLANT
PENCIL SMOKE EVACUATOR (MISCELLANEOUS) ×1 IMPLANT
POUCH LAPAROSCOPIC INSTRUMENT (MISCELLANEOUS) ×1 IMPLANT
RELOAD STAPLE 60 2.6 WHT THN (STAPLE) IMPLANT
RELOAD STAPLE 60 4.1 GRN THCK (STAPLE) IMPLANT
RELOAD STAPLER GREEN 60MM (STAPLE) ×3 IMPLANT
RELOAD STAPLER WHITE 60MM (STAPLE) ×3 IMPLANT
SCISSORS LAP 5X35 DISP (ENDOMECHANICALS) IMPLANT
SET IRRIG TUBING LAPAROSCOPIC (IRRIGATION / IRRIGATOR) ×1 IMPLANT
SET TUBE SMOKE EVAC HIGH FLOW (TUBING) ×1 IMPLANT
SHEARS HARMONIC ACE PLUS 36CM (ENDOMECHANICALS) ×1 IMPLANT
SLEEVE ENDOPATH XCEL 5M (ENDOMECHANICALS) ×3 IMPLANT
SLEEVE Z-THREAD 5X100MM (TROCAR) IMPLANT
SPECIMEN JAR X LARGE (MISCELLANEOUS) ×1 IMPLANT
SPONGE SURGIFOAM ABS GEL 100 (HEMOSTASIS) IMPLANT
STAPLE ECHEON FLEX 60 POW ENDO (STAPLE) ×1 IMPLANT
STAPLE LINE REINFORCEMENT LAP (STAPLE) IMPLANT
STAPLER RELOAD GREEN 60MM (STAPLE) ×3
STAPLER RELOAD WHITE 60MM (STAPLE) ×3
SUCTION POOLE HANDLE (INSTRUMENTS) ×1
SUT ETHILON 2 0 FS 18 (SUTURE) IMPLANT
SUT MNCRL AB 4-0 PS2 18 (SUTURE) ×1 IMPLANT
SUT NOVA NAB DX-16 0-1 5-0 T12 (SUTURE) IMPLANT
SUT PDS AB 1 CT  36 (SUTURE) ×2
SUT PDS AB 1 CT 36 (SUTURE) IMPLANT
SUT PDS AB 1 CTX 36 (SUTURE) IMPLANT
SUT PDS AB 1 TP1 96 (SUTURE) ×2 IMPLANT
SUT PDS AB 3-0 SH 27 (SUTURE) IMPLANT
SUT PDS II 0 TP-1 LOOPED 60 (SUTURE) IMPLANT
SUT PROLENE 2 0 SH 30 (SUTURE) ×1 IMPLANT
SUT PROLENE 3 0 SH 1 (SUTURE) ×1 IMPLANT
SUT PROLENE 3 0 SH 48 (SUTURE) IMPLANT
SUT PROLENE 4 0 RB 1 (SUTURE) ×1
SUT PROLENE 4 0 SH DA (SUTURE) IMPLANT
SUT PROLENE 4-0 RB1 .5 CRCL 36 (SUTURE) IMPLANT
SUT PROLENE 5 0 C1 (SUTURE) IMPLANT
SUT SILK 0 TIES 10X30 (SUTURE) ×1 IMPLANT
SUT SILK 2 0 SH (SUTURE) ×2 IMPLANT
SUT SILK 2 0 TIES 10X30 (SUTURE) ×1 IMPLANT
SUT SILK 2 0SH CR/8 30 (SUTURE) ×1 IMPLANT
SUT SILK 3 0 TIES 10X30 (SUTURE) IMPLANT
SUT SILK 3 0SH CR/8 30 (SUTURE) ×1 IMPLANT
SUT VIC AB 2-0 CT1 27 (SUTURE)
SUT VIC AB 2-0 CT1 TAPERPNT 27 (SUTURE) ×1 IMPLANT
SUT VIC AB 3-0 SH 18 (SUTURE) ×1 IMPLANT
SUT VIC AB 3-0 SH 27 (SUTURE) ×1
SUT VIC AB 3-0 SH 27X BRD (SUTURE) ×1 IMPLANT
SUT VICRYL 0 AB UR-6 (SUTURE) IMPLANT
SYS LAPSCP GELPORT 120MM (MISCELLANEOUS) ×1
SYSTEM LAPSCP GELPORT 120MM (MISCELLANEOUS) ×1 IMPLANT
TOWEL GREEN STERILE (TOWEL DISPOSABLE) ×1 IMPLANT
TOWEL GREEN STERILE FF (TOWEL DISPOSABLE) ×1 IMPLANT
TRAY FOL W/BAG SLVR 16FR STRL (SET/KITS/TRAYS/PACK) IMPLANT
TRAY FOLEY W/BAG SLVR 16FR LF (SET/KITS/TRAYS/PACK) ×1
TRAY LAPAROSCOPIC MC (CUSTOM PROCEDURE TRAY) ×1 IMPLANT
TROCAR Z THREAD OPTICAL 12X100 (TROCAR) IMPLANT
TROCAR Z-THREAD OPTICAL 5X100M (TROCAR) ×1 IMPLANT
TUBE CONNECTING 12X1/4 (SUCTIONS) IMPLANT
WARMER LAPAROSCOPE (MISCELLANEOUS) ×1 IMPLANT
YANKAUER SUCT BULB TIP NO VENT (SUCTIONS) IMPLANT

## 2022-07-22 NOTE — Op Note (Signed)
Date: 07/22/22  Patient: Kiara Harris MRN: [id number]  Preoperative Diagnosis: Pancreatic mucinous cystic neoplasm Postoperative Diagnosis: Same  Procedure: Laparoscopic distal pancreatectomy with splenectomy  Surgeon: Michaelle Birks, MD Assistant: Ralene Ok, MD  EBL: 50 mL  Anesthesia: General endotracheal  Specimens: Distal pancreas and spleen  Indications: Ms. Lekas is a 21 yo female who presented with an incidentally diagnosed cystic mass in the tail of the pancreas. EUS with FNA was consistent with a mucinous cystic neoplasm. Resection was recommended and after a discussion of the risks and benefits of surgery, the patient agreed to proceed.  Findings: Multilobulated cystic mass in the tail of the pancreas abutting the spleen.  Procedure details: Informed consent was obtained in the preoperative area prior to the procedure. The patient was brought to the operating room and placed on the table in the supine position. General anesthesia was induced and appropriate lines and drains were placed for intraoperative monitoring. Perioperative antibiotics were administered per SCIP guidelines. The abdomen was prepped and draped in the usual sterile fashion. A pre-procedure timeout was taken verifying patient identity, surgical site and procedure to be performed.  A small skin incision was made superior to the umbilicus and the subcutaneous tissue was divided with cautery to expose the fascia.  The fascia was opened along the linea alba with cautery and the peritoneum was opened.  A small wound protector was placed, the GelPort cap was applied, and the abdomen was insufflated.  Two 5 mm ports were placed via the GelPort.  The abdomen was inspected with no evidence of visceral or vascular injury.  2 additional 5 mm ports were placed in the left upper quadrant under direct visualization.  The stomach was grasped and elevated, and the gastrocolic omentum was opened on the proximal greater  curve of the stomach using harmonic shears, taking care to preserve the gastroepiploic vessels.  This dissection was carried proximally along the greater curve of the stomach.  The short gastric vessels were divided with harmonic shears.  This allowed complete exposure of the tail of the pancreas, and the cystic mass was visualized in the tail abutting the splenic hilum.  A small subxiphoid incision was made and a Nathanson retractor was placed to elevate the stomach and the left lobe of the liver off the tail of the pancreas.  The inferior border of the tail of the pancreas was dissected out with harmonic shears.  The splenic flexure of the colon was taken down off the spleen using the harmonic, taking care not to injure the colon.  A plane was created between the posterior tail of the pancreas and the retroperitoneum using blunt dissection and harmonic shears.  The superior border of the tail of the pancreas was identified.  The splenic artery was visualized along the superior border, and the peritoneum was opened over the artery using the harmonic shears.  The artery was circumferentially dissected out using blunt dissection.  It was clear that this was splenic artery as this was in the tail of the pancreas. One of the 1m ports in the GelPort was upsized to a 153mport to accommodate a stapler. The splenic artery was divided with an Echelon stapler with a white load.  Next the tail of the pancreas was elevated, and the splenic vein was visualized on the posterior aspect of the pancreas.  The vein was carefully dissected out circumferentially using blunt dissection.  The splenic vein was then divided with a white load of an Echelon stapler.  At this point the pancreatic parenchyma was completely isolated, and there was a clear gross margin of at least 2 cm between the planned point of resection and the cystic mass.  The pancreatic parenchyma was divided with two green loads of an Echelon stapler reinforced with  Peri-Strips. Each staple load was closed very slowly to prevent the gland from cracking. A third load was needed to complete the end of the staple line, and Peristrips were not used for this load.  After dividing the pancreas, an additional small branch of the splenic vein was visible on the superior border of the pancreas.  This was circumferentially dissected out bluntly, and was divided with a white load of the Echelon stapler.  The tail of the pancreas with the cyst was then dissected off the retroperitoneum with blunt dissection and harmonic shears.  The cyst was kept fully intact during this dissection.  The splenic attachments to the retroperitoneum and the diaphragm were divided with harmonic shears, to remove the spleen en bloc with the tail of the pancreas.  Once the specimen was completely free, it was extracted via the GelPort site.  This required extension of the fascial incision.  The specimen was examined and the cyst was completely intact with a clear gross margin of normal pancreatic tissue.  The specimen was sent for routine pathology.  The surgical site was irrigated and closely inspected.  The retroperitoneum appeared completely hemostatic.  The staple lines on the pancreas and the splenic vessels were closely examined and were hemostatic.  The stomach was closely examined, and the short gastric vessels stumps were hemostatic, with no signs of injury to the stomach.   A 19 [place] JP drain was brought onto the field and placed through the more medial left upper quadrant port into the abdomen.  The drain was placed adjacent to the remnant pancreas and in the splenic fossa.  It was secured to the skin with a 2-0 nylon suture.  The Nathanson retractor was removed and the stomach was placed back in its proper anatomic position.  The stomach and liver were examined and had no signs of retractor injury.  The ports were removed and the abdomen was desufflated.  The fascia was closed at the site of the  GelPort with a running 1 PDS suture.  Scarpa's layer was closed with a running 3-0 Vicryl suture, and the deep dermis was closed with running 3-0 Vicryl suture.  The skin at the GelPort site and the remaining port sites was closed with 4-0 Monocryl subcuticular suture.  Dermabond was applied.  The patient tolerated the procedure well with no apparent complications.  All counts were correct x2 at the end of the procedure. The patient was extubated and taken to PACU in stable condition.  Michaelle Birks, MD 07/22/22 10:26 AM

## 2022-07-22 NOTE — H&P (Signed)
Kiara Harris is an 21 y.o. female.   Chief Complaint: pancreatic MCN HPI: Kiara Harris is a 21 yo female who was diagnosed in December 2022 with a cystic mass of the pancreas, which was noted incidentally on a lumbar spine MRI. She had a follow MRCP in June of this year. An EUS was performed on 05/17/22, and showed a 4.4cm cystic septated mass in the tail of the pancreas. FNA showed now malignant cells, with a high CEA and low amylase, consistent with a mucinous cystic neoplasm. She is here today for surgical resection.   Past Medical History:  Diagnosis Date   Acid reflux    Anxiety and depression    Asthma    Cancer (HCC)    Pancreatic mucinous cystic neoplasm   Family history of adverse reaction to anesthesia    Mother trouble waking   Migraine    PCOS (polycystic ovarian syndrome)     Past Surgical History:  Procedure Laterality Date   BIOPSY  05/17/2022   Procedure: BIOPSY;  Surgeon: Irving Copas., MD;  Location: [facility];  Service: Gastroenterology;;   ESOPHAGOGASTRODUODENOSCOPY (EGD) WITH PROPOFOL N/A 05/17/2022   Procedure: ESOPHAGOGASTRODUODENOSCOPY (EGD) WITH PROPOFOL;  Surgeon: Irving Copas., MD;  Location: [facility];  Service: Gastroenterology;  Laterality: N/A;   EUS N/A 05/17/2022   Procedure: UPPER ENDOSCOPIC ULTRASOUND (EUS) LINEAR;  Surgeon: Irving Copas., MD;  Location: [facility];  Service: Gastroenterology;  Laterality: N/A;   FINE NEEDLE ASPIRATION  05/17/2022   Procedure: FINE NEEDLE ASPIRATION (FNA) LINEAR;  Surgeon: Rush Landmark Telford Nab., MD;  Location: [facility];  Service: Gastroenterology;;   WISDOM TOOTH EXTRACTION Bilateral     Family History  Problem Relation Age of Onset   Cirrhosis Mother        non-alcoholic cirrhosis   Cancer Maternal Grandmother        gallbladder   Heart attack Maternal Grandfather    Social History:  reports that she has quit smoking. Her smoking use included e-cigarettes.  She has never used smokeless tobacco. She reports current alcohol use. She reports that she does not use drugs.  Allergies:  Allergies  Allergen Reactions   Doxycycline Hyclate Nausea And Vomiting   Doxycycline Nausea And Vomiting   Latex Itching    With prolonged exposure=itching.   Amoxil [Amoxicillin] Rash   Multihance [Gadobenate] Itching    Hands and throat itching after MRI scan on 08/07/21. Pt would need 13 hr prep prior to MRI contrast. - Dr. Logan Bores.     Medications Prior to Admission  Medication Sig Dispense Refill   acetaminophen (TYLENOL) 325 MG tablet Take 650 mg by mouth every 6 (six) hours as needed for moderate pain.     albuterol (VENTOLIN HFA) 108 (90 Base) MCG/ACT inhaler Inhale 1-2 puffs into the lungs every 6 (six) hours as needed for wheezing or shortness of breath.     buPROPion ER (WELLBUTRIN SR) 100 MG 12 hr tablet Take 100 mg by mouth daily as needed (depression).     Butalbital-APAP-Caffeine 50-300-40 MG CAPS Take 1 capsule by mouth every 8 (eight) hours as needed (headache).     dicyclomine (BENTYL) 20 MG tablet Take 20 mg by mouth 2 (two) times daily as needed for spasms or pain.     Lactic Ac-Citric Ac-Pot Bitart (PHEXXI) 1.8-1-0.4 % GEL Place 5 g vaginally as directed. Up to 1 hour before intercourse 120 g 11   meloxicam (MOBIC) 7.5 MG tablet Take 7.5 mg by mouth  2 (two) times daily as needed for pain.     ondansetron (ZOFRAN-ODT) 4 MG disintegrating tablet Take 4 mg by mouth every 8 (eight) hours as needed for vomiting or nausea.     pantoprazole (PROTONIX) 40 MG tablet Take 40 mg by mouth 2 (two) times daily as needed (acid reflux/indigestion).      No results found for this or any previous visit (from the past 48 hour(s)). No results found.  Review of Systems  Blood pressure 123/85, pulse 88, temperature 98.5 F (36.9 C), temperature source Oral, resp. rate 16, height 5' 5.5" (1.664 m), weight 122.5 kg, last menstrual period 06/25/2022, SpO2 100  %. Physical Exam Vitals reviewed.  Constitutional:      General: She is not in acute distress.    Appearance: Normal appearance.  HENT:     Head: Normocephalic and atraumatic.  Eyes:     General: No scleral icterus.    Conjunctiva/sclera: Conjunctivae normal.  Cardiovascular:     Rate and Rhythm: Normal rate and regular rhythm.  Pulmonary:     Effort: Pulmonary effort is normal. No respiratory distress.  Abdominal:     General: There is no distension.     Palpations: Abdomen is soft.     Tenderness: There is no abdominal tenderness.  Musculoskeletal:        General: Normal range of motion.  Skin:    General: Skin is warm and dry.     Coloration: Skin is not jaundiced.  Neurological:     General: No focal deficit present.     Mental Status: She is alert and oriented to person, place, and time.      Assessment/Plan 21 yo female with mucinous cystic neoplasm of the tail of the pancreas. Proceed to OR for laparoscopic distal pancreatectomy, possible splenectomy. Procedure details have been reviewed with the patient and her mother and informed consent obtained. Type and cross completed. Admit to inpatient postoperatively.  Dwan Bolt, MD 07/22/2022, 6:57 AM

## 2022-07-22 NOTE — Transfer of Care (Signed)
Immediate Anesthesia Transfer of Care Note  Patient: Kiara Harris  Procedure(s) Performed: LAPAROSCOPIC DISTAL PANCREATECTOMY SPLENECTOMY  Patient Location: PACU  Anesthesia Type:General  Level of Consciousness: drowsy and patient cooperative  Airway & Oxygen Therapy: Patient Spontanous Breathing and Patient connected to nasal cannula oxygen  Post-op Assessment: Report given to RN, Post -op Vital signs reviewed and stable, and Patient moving all extremities X 4  Post vital signs: Reviewed and stable  Last Vitals:  Vitals Value Taken Time  BP 142/93 07/22/22 1016  Temp    Pulse 71 07/22/22 1019  Resp 17 07/22/22 1019  SpO2 100 % 07/22/22 1019  Vitals shown include unvalidated device data.  Last Pain:  Vitals:   07/22/22 0619  TempSrc: Oral  PainSc:          Complications: No notable events documented.

## 2022-07-22 NOTE — Anesthesia Procedure Notes (Signed)
Arterial Line Insertion Start/End12/14/2023 7:48 AM, 07/22/2022 7:51 AM Performed by: Effie Berkshire, MD, anesthesiologist  Patient location: Pre-op. Preanesthetic checklist: patient identified, IV checked, site marked, risks and benefits discussed, surgical consent, monitors and equipment checked, pre-op evaluation, timeout performed and anesthesia consent Lidocaine 1% used for infiltration Right, radial was placed Catheter size: 20 G Hand hygiene performed  and maximum sterile barriers used   Attempts: 1 Procedure performed without using ultrasound guided technique. Following insertion, dressing applied and Biopatch. Post procedure assessment: normal and unchanged  Patient tolerated the procedure well with no immediate complications.

## 2022-07-22 NOTE — Anesthesia Preprocedure Evaluation (Addendum)
Anesthesia Evaluation  Patient identified by MRN, date of birth, ID band Patient awake    Reviewed: Allergy & Precautions, NPO status , Patient's Chart, lab work & pertinent test results  Airway Mallampati: I  TM Distance: >3 FB Neck ROM: Full    Dental  (+) Teeth Intact, Dental Advisory Given   Pulmonary asthma , former smoker   breath sounds clear to auscultation       Cardiovascular negative cardio ROS  Rhythm:Regular Rate:Normal     Neuro/Psych  Headaches PSYCHIATRIC DISORDERS Anxiety Depression       GI/Hepatic Neg liver ROS,GERD  Medicated,,  Endo/Other  negative endocrine ROS    Renal/GU negative Renal ROS     Musculoskeletal negative musculoskeletal ROS (+)    Abdominal  (+) + obese  Peds  Hematology negative hematology ROS (+)   Anesthesia Other Findings   Reproductive/Obstetrics                             Anesthesia Physical Anesthesia Plan  ASA: 3  Anesthesia Plan: General   Post-op Pain Management: Tylenol PO (pre-op)*, Celebrex PO (pre-op)* and Lidocaine infusion*   Induction: Intravenous  PONV Risk Score and Plan: 4 or greater and Ondansetron, Dexamethasone, Midazolam and Scopolamine patch - Pre-op  Airway Management Planned: Oral ETT  Additional Equipment: Arterial line  Intra-op Plan:   Post-operative Plan: Extubation in OR  Informed Consent: I have reviewed the patients History and Physical, chart, labs and discussed the procedure including the risks, benefits and alternatives for the proposed anesthesia with the patient or authorized representative who has indicated his/her understanding and acceptance.     Dental advisory given  Plan Discussed with: CRNA  Anesthesia Plan Comments:        Anesthesia Quick Evaluation

## 2022-07-22 NOTE — Anesthesia Postprocedure Evaluation (Signed)
Anesthesia Post Note  Patient: Kiara Harris  Procedure(s) Performed: LAPAROSCOPIC DISTAL PANCREATECTOMY SPLENECTOMY     Patient location during evaluation: PACU Anesthesia Type: General Level of consciousness: awake and alert Pain management: pain level controlled Vital Signs Assessment: post-procedure vital signs reviewed and stable Respiratory status: spontaneous breathing, nonlabored ventilation, respiratory function stable and patient connected to nasal cannula oxygen Cardiovascular status: blood pressure returned to baseline and stable Postop Assessment: no apparent nausea or vomiting Anesthetic complications: no   No notable events documented.  Last Vitals:  Vitals:   07/22/22 1300 07/22/22 1330  BP: 126/67 128/73  Pulse: 72 72  Resp: 15 15  Temp:    SpO2: 97% 97%    Last Pain:  Vitals:   07/22/22 1300  TempSrc:   PainSc: [place]

## 2022-07-22 NOTE — Anesthesia Procedure Notes (Signed)
Procedure Name: Intubation Date/Time: 07/22/2022 7:45 AM  Performed by: Darletta Moll, CRNAPre-anesthesia Checklist: Patient identified, Emergency Drugs available, Suction available and Patient being monitored Patient Re-evaluated:Patient Re-evaluated prior to induction Oxygen Delivery Method: Circle system utilized Preoxygenation: Pre-oxygenation with 100% oxygen Induction Type: IV induction Ventilation: Mask ventilation without difficulty Laryngoscope Size: Mac and 3 Grade View: Grade I Tube type: Oral Tube size: 7.0 mm Number of attempts: 1 Airway Equipment and Method: Stylet and Oral airway Placement Confirmation: ETT inserted through vocal cords under direct vision, positive ETCO2 and breath sounds checked- equal and bilateral Secured at: 21 cm Tube secured with: Tape Dental Injury: Teeth and Oropharynx as per pre-operative assessment

## 2022-07-23 ENCOUNTER — Encounter (HOSPITAL_COMMUNITY): Payer: Self-pay | Admitting: Surgery

## 2022-07-23 LAB — CYTOLOGY - PAP
Adequacy: ABSENT
Chlamydia: NEGATIVE
Comment: NEGATIVE
Comment: NEGATIVE
Comment: NORMAL
Diagnosis: NEGATIVE
Neisseria Gonorrhea: NEGATIVE
Trichomonas: NEGATIVE

## 2022-07-23 LAB — CBC
HCT: 36.4 % (ref 36.0–46.0)
Hemoglobin: 12.2 g/dL (ref 12.0–15.0)
MCH: 29.5 pg (ref 26.0–34.0)
MCHC: 33.5 g/dL (ref 30.0–36.0)
MCV: 88.1 fL (ref 80.0–100.0)
Platelets: 348 10*3/uL (ref 150–400)
RBC: 4.13 MIL/uL (ref 3.87–5.11)
RDW: 12.3 % (ref 11.5–15.5)
WBC: 27.2 10*3/uL — ABNORMAL HIGH (ref 4.0–10.5)
nRBC: 0 % (ref 0.0–0.2)

## 2022-07-23 LAB — BASIC METABOLIC PANEL
Anion gap: 10 (ref 5–15)
BUN: 8 mg/dL (ref 6–20)
CO2: 23 mmol/L (ref 22–32)
Calcium: 9.1 mg/dL (ref 8.9–10.3)
Chloride: 104 mmol/L (ref 98–111)
Creatinine, Ser: 0.7 mg/dL (ref 0.44–1.00)
GFR, Estimated: >60 mL/min (ref >60)
Glucose, Bld: 123 mg/dL — ABNORMAL HIGH (ref 70–99)
Potassium: 4 mmol/L (ref 3.5–5.1)
Sodium: 137 mmol/L (ref 135–145)

## 2022-07-23 LAB — GLUCOSE, CAPILLARY
Glucose-Capillary: 121 mg/dL — ABNORMAL HIGH (ref 70–99)
Glucose-Capillary: 96 mg/dL (ref 70–99)
Glucose-Capillary: 83 mg/dL (ref 70–99)
Glucose-Capillary: 82 mg/dL (ref 70–99)

## 2022-07-23 LAB — AMYLASE: Amylase: 125 U/L — ABNORMAL HIGH (ref 28–100)

## 2022-07-23 MED ORDER — INSULIN ASPART 100 UNIT/ML IJ SOLN
0.0000 [IU] | Freq: Three times a day (TID) | INTRAMUSCULAR | Status: DC
  Administered 2022-07-23: 2 [IU] via SUBCUTANEOUS

## 2022-07-23 MED ORDER — INSULIN ASPART 100 UNIT/ML IJ SOLN: 0.0000 [IU] | Freq: Every day | INTRAMUSCULAR | Status: DC

## 2022-07-23 NOTE — Progress Notes (Signed)
    1 Day Post-Op  Subjective: No acute events overnight. Pain adequately controlled. Afebrile, vitals stable. Denies nausea/vomiting.   Objective: Vital signs in last 24 hours: Temp:  [97.7 F (36.5 C)-99.1 F (37.3 C)] 99 F (37.2 C) (12/15 0441) Pulse Rate:  [57-89] 57 (12/15 0441) Resp:  [12-18] 16 (12/15 0441) BP: (87-142)/(50-93) 110/58 (12/15 0441) SpO2:  [95 %-100 %] 99 % (12/15 0441) Last BM Date :  (PTA)  Intake/Output from previous day: 12/14 0701 - 12/15 0700 In: 1629.3 [I.V.:1319.3; IV Piggyback:310] Out: 1720 [Urine:1600; Drains:70; Blood:50] Intake/Output this shift: No intake/output data recorded.  PE: General: resting comfortably, NAD Neuro: alert and oriented, no focal deficits Resp: normal work of breathing on room air Abdomen: soft, nondistended, nontender to palpation. Incisions clean and dry with no erythema or induration. LUQ JP with serosanguinous drainage. Extremities: warm and well-perfused   Lab Results:  Recent Labs    07/22/22 1204 07/23/22 0256  WBC 44.7* 27.2*  HGB 14.8 12.2  HCT 43.7 36.4  PLT 381 348   BMET Recent Labs    07/22/22 1204 07/23/22 0256  NA 139 137  K 4.0 4.0  CL 105 104  CO2 24 23  GLUCOSE 124* 123*  BUN 11 8  CREATININE 0.78 0.70  CALCIUM 9.4 9.1   PT/INR No results for input(s): "LABPROT", "INR" in the last 72 hours. CMP     Component Value Date/Time   NA 137 07/23/2022 0256   K 4.0 07/23/2022 0256   CL 104 07/23/2022 0256   CO2 23 07/23/2022 0256   GLUCOSE 123 (H) 07/23/2022 0256   BUN 8 07/23/2022 0256   CREATININE 0.70 07/23/2022 0256   CALCIUM 9.1 07/23/2022 0256   PROT 7.5 05/28/2022 1400   ALBUMIN 4.3 05/28/2022 1400   AST 30 05/28/2022 1400   ALT 29 05/28/2022 1400   ALKPHOS 35 (L) 05/28/2022 1400   BILITOT 1.0 05/28/2022 1400   GFRNONAA >60 07/23/2022 0256   Lipase     Component Value Date/Time   LIPASE 42 05/28/2022 1400       Studies/Results: No results  found.  Anti-infectives: Anti-infectives (From admission, onward)    Start     Dose/Rate Route Frequency Ordered Stop   07/22/22 0600  ceFAZolin (ANCEF) IVPB 3g/100 mL premix       See Hyperspace for full Linked Orders Report.   3 g 200 mL/hr over 30 Minutes Intravenous On call to O.R. 07/22/22 0762 07/22/22 0814   07/22/22 0600  metroNIDAZOLE (FLAGYL) IVPB 500 mg       See Hyperspace for full Linked Orders Report.   500 mg 100 mL/hr over 60 Minutes Intravenous On call to O.R. 07/22/22 2633 07/22/22 0756        Assessment/Plan 21 yo female with mucinous cystic neoplasm of the tail of the pancreas, POD1 s/p laparoscopic distal pancreatectomy with splenectomy. - Advance to clear liquid diet, IVF at 75 ml/hr - Multimodal pain control - Ambulate, pulmonary toilet - Sliding scale insulin - VTE: lovenox, SCDs - Dispo: inpatient, med-surg floor     LOS: 1 day    Michaelle Birks, MD [facility] Surgery General, Hepatobiliary and Pancreatic Surgery 07/23/22 7:31 AM

## 2022-07-24 LAB — CBC
HCT: 32.6 % — ABNORMAL LOW (ref 36.0–46.0)
Hemoglobin: 11.1 g/dL — ABNORMAL LOW (ref 12.0–15.0)
MCH: 30.8 pg (ref 26.0–34.0)
MCHC: 34 g/dL (ref 30.0–36.0)
MCV: 90.6 fL (ref 80.0–100.0)
Platelets: 303 10*3/uL (ref 150–400)
RBC: 3.6 MIL/uL — ABNORMAL LOW (ref 3.87–5.11)
RDW: 12.5 % (ref 11.5–15.5)
WBC: 29 10*3/uL — ABNORMAL HIGH (ref 4.0–10.5)
nRBC: 0 % (ref 0.0–0.2)

## 2022-07-24 LAB — BASIC METABOLIC PANEL
Anion gap: 8 (ref 5–15)
BUN: 6 mg/dL (ref 6–20)
CO2: 22 mmol/L (ref 22–32)
Calcium: 8.3 mg/dL — ABNORMAL LOW (ref 8.9–10.3)
Chloride: 108 mmol/L (ref 98–111)
Creatinine, Ser: 0.64 mg/dL (ref 0.44–1.00)
GFR, Estimated: >60 mL/min (ref >60)
Glucose, Bld: 96 mg/dL (ref 70–99)
Potassium: 3.8 mmol/L (ref 3.5–5.1)
Sodium: 138 mmol/L (ref 135–145)

## 2022-07-24 LAB — GLUCOSE, CAPILLARY
Glucose-Capillary: 96 mg/dL (ref 70–99)
Glucose-Capillary: 88 mg/dL (ref 70–99)
Glucose-Capillary: 111 mg/dL — ABNORMAL HIGH (ref 70–99)
Glucose-Capillary: 87 mg/dL (ref 70–99)

## 2022-07-24 MED ORDER — HYDROMORPHONE HCL 1 MG/ML IJ SOLN
0.5000 mg | INTRAMUSCULAR | Status: DC | PRN
  Administered 2022-07-24: 0.5 mg via INTRAVENOUS
  Filled 2022-07-24 (×2): qty 0.5

## 2022-07-24 NOTE — Progress Notes (Signed)
    2 Days Post-Op  Subjective: No acute events overnight. Tolerating clear liquids, no nausea or vomiting. Ambulated in room.   Objective: Vital signs in last 24 hours: Temp:  [98.4 F (36.9 C)-99.1 F (37.3 C)] 98.4 F (36.9 C) (12/16 0609) Pulse Rate:  [58-100] 100 (12/16 0609) Resp:  [17-18] 18 (12/16 0609) BP: (99-129)/(54-63) 112/63 (12/16 0609) SpO2:  [96 %-100 %] 96 % (12/16 0609) Last BM Date :  (PTA)  Intake/Output from previous day: 12/15 0701 - 12/16 0700 In: 2778.7 [P.O.:440; I.V.:1898.7; IV Piggyback:440] Out: 1370 [Urine:1200; Drains:170] Intake/Output this shift: Total I/O In: 2778.7 [P.O.:440; I.V.:1898.7; IV Piggyback:440] Out: 770 [Urine:650; Drains:120]  PE: General: resting comfortably, NAD Neuro: alert and oriented, no focal deficits Resp: normal work of breathing on room air Abdomen: soft, nondistended, nontender to palpation. Incisions clean and dry with no erythema or induration. LUQ JP with serosanguinous drainage. Extremities: warm and well-perfused   Lab Results:  Recent Labs    07/23/22 0256 07/24/22 0202  WBC 27.2* 29.0*  HGB 12.2 11.1*  HCT 36.4 32.6*  PLT 348 303   BMET Recent Labs    07/23/22 0256 07/24/22 0202  NA 137 138  K 4.0 3.8  CL 104 108  CO2 23 22  GLUCOSE 123* 96  BUN 8 6  CREATININE 0.70 0.64  CALCIUM 9.1 8.3*   PT/INR No results for input(s): "LABPROT", "INR" in the last 72 hours. CMP     Component Value Date/Time   NA 138 07/24/2022 0202   K 3.8 07/24/2022 0202   CL 108 07/24/2022 0202   CO2 22 07/24/2022 0202   GLUCOSE 96 07/24/2022 0202   BUN 6 07/24/2022 0202   CREATININE 0.64 07/24/2022 0202   CALCIUM 8.3 (L) 07/24/2022 0202   PROT 7.5 05/28/2022 1400   ALBUMIN 4.3 05/28/2022 1400   AST 30 05/28/2022 1400   ALT 29 05/28/2022 1400   ALKPHOS 35 (L) 05/28/2022 1400   BILITOT 1.0 05/28/2022 1400   GFRNONAA >60 07/24/2022 0202   Lipase     Component Value Date/Time   LIPASE 42 05/28/2022  1400       Studies/Results: No results found.  Anti-infectives: Anti-infectives (From admission, onward)    Start     Dose/Rate Route Frequency Ordered Stop   07/22/22 0600  ceFAZolin (ANCEF) IVPB 3g/100 mL premix       See Hyperspace for full Linked Orders Report.   3 g 200 mL/hr over 30 Minutes Intravenous On call to O.R. 07/22/22 4008 07/22/22 0814   07/22/22 0600  metroNIDAZOLE (FLAGYL) IVPB 500 mg       See Hyperspace for full Linked Orders Report.   500 mg 100 mL/hr over 60 Minutes Intravenous On call to O.R. 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 - Advance to regular diet, SLIV - Multimodal pain control - Ambulate, pulmonary toilet - Sliding scale insulin, blood sugar well-controlled - Leukocytosis: Secondary to asplenia, no other signs of sepsis - I reviewed patient's immunization record this morning via her records, and she has previously received HiB, Menactra and Bexsero vaccines, and recently received pneumococcal. She is up to date on vaccines. - VTE: lovenox, SCDs - Dispo: inpatient, med-surg floor     LOS: 2 days    Michaelle Birks, MD [facility] General, Hepatobiliary and Pancreatic Surgery 07/24/22 6:49 AM

## 2022-07-25 LAB — CBC
HCT: 33.2 % — ABNORMAL LOW (ref 36.0–46.0)
Hemoglobin: 11.2 g/dL — ABNORMAL LOW (ref 12.0–15.0)
MCH: 30.1 pg (ref 26.0–34.0)
MCHC: 33.7 g/dL (ref 30.0–36.0)
MCV: 89.2 fL (ref 80.0–100.0)
Platelets: 300 10*3/uL (ref 150–400)
RBC: 3.72 MIL/uL — ABNORMAL LOW (ref 3.87–5.11)
RDW: 12.6 % (ref 11.5–15.5)
WBC: 25.8 10*3/uL — ABNORMAL HIGH (ref 4.0–10.5)
nRBC: 0 % (ref 0.0–0.2)

## 2022-07-25 LAB — BASIC METABOLIC PANEL
Anion gap: 9 (ref 5–15)
BUN: 6 mg/dL (ref 6–20)
CO2: 23 mmol/L (ref 22–32)
Calcium: 8.4 mg/dL — ABNORMAL LOW (ref 8.9–10.3)
Chloride: 105 mmol/L (ref 98–111)
Creatinine, Ser: 0.68 mg/dL (ref 0.44–1.00)
GFR, Estimated: >60 mL/min (ref >60)
Glucose, Bld: 90 mg/dL (ref 70–99)
Potassium: 3.5 mmol/L (ref 3.5–5.1)
Sodium: 137 mmol/L (ref 135–145)

## 2022-07-25 MED ORDER — METHOCARBAMOL 750 MG PO TABS
750.0000 mg | ORAL_TABLET | Freq: Four times a day (QID) | ORAL | Status: DC | PRN
  Administered 2022-07-25 (×2): 750 mg via ORAL
  Filled 2022-07-25 (×2): qty 1

## 2022-07-25 MED ORDER — SCOPOLAMINE 1 MG/3DAYS TD PT72
1.0000 | MEDICATED_PATCH | TRANSDERMAL | Status: DC
  Administered 2022-07-25: 1.5 mg via TRANSDERMAL
  Filled 2022-07-25: qty 1

## 2022-07-25 MED ORDER — ORAL CARE MOUTH RINSE: 15.0000 mL | OROMUCOSAL | Status: DC | PRN

## 2022-07-25 MED ORDER — TRAMADOL HCL 50 MG PO TABS
50.0000 mg | ORAL_TABLET | Freq: Four times a day (QID) | ORAL | Status: DC | PRN
  Administered 2022-07-25 – 2022-07-26 (×3): 50 mg via ORAL
  Filled 2022-07-25 (×3): qty 1

## 2022-07-25 NOTE — Progress Notes (Signed)
    3 Days Post-Op  Subjective: Increased pain this morning with intermittent nausea. Tolerating solid food yesterday. Passing flatus. Ambulating.   Objective: Vital signs in last 24 hours: Temp:  [98.3 F (36.8 C)-100 F (37.8 C)] 100 F (37.8 C) (12/17 0447) Pulse Rate:  [96-109] 99 (12/17 0447) Resp:  [17-18] 18 (12/17 0447) BP: (109-136)/(66-87) 120/66 (12/17 0447) SpO2:  [96 %-100 %] 97 % (12/17 0447) Last BM Date :  (pta)  Intake/Output from previous day: 12/16 0701 - 12/17 0700 In: 220 [IV Piggyback:220] Out: 280 [Drains:280] Intake/Output this shift: Total I/O In: 220 [IV Piggyback:220] Out: 150 [Drains:150]  PE: General: resting comfortably, NAD Neuro: alert and oriented, no focal deficits Resp: normal work of breathing on room air Abdomen: soft, nondistended, nontender to palpation. Incisions clean and dry with mild ecchymosis, no erythema or induration. LUQ JP with serosanguinous drainage. Extremities: warm and well-perfused   Lab Results:  Recent Labs    07/24/22 0202 07/25/22 0150  WBC 29.0* 25.8*  HGB 11.1* 11.2*  HCT 32.6* 33.2*  PLT 303 300   BMET Recent Labs    07/24/22 0202 07/25/22 0150  NA 138 137  K 3.8 3.5  CL 108 105  CO2 22 23  GLUCOSE 96 90  BUN 6 6  CREATININE 0.64 0.68  CALCIUM 8.3* 8.4*   PT/INR No results for input(s): "LABPROT", "INR" in the last 72 hours. CMP     Component Value Date/Time   NA 137 07/25/2022 0150   K 3.5 07/25/2022 0150   CL 105 07/25/2022 0150   CO2 23 07/25/2022 0150   GLUCOSE 90 07/25/2022 0150   BUN 6 07/25/2022 0150   CREATININE 0.68 07/25/2022 0150   CALCIUM 8.4 (L) 07/25/2022 0150   PROT 7.5 05/28/2022 1400   ALBUMIN 4.3 05/28/2022 1400   AST 30 05/28/2022 1400   ALT 29 05/28/2022 1400   ALKPHOS 35 (L) 05/28/2022 1400   BILITOT 1.0 05/28/2022 1400   GFRNONAA >60 07/25/2022 0150   Lipase     Component Value Date/Time   LIPASE 42 05/28/2022 1400       Studies/Results: No  results found.  Anti-infectives: Anti-infectives (From admission, onward)    Start     Dose/Rate Route Frequency Ordered Stop   07/22/22 0600  ceFAZolin (ANCEF) IVPB 3g/100 mL premix       See Hyperspace for full Linked Orders Report.   3 g 200 mL/hr over 30 Minutes Intravenous On call to O.R. 07/22/22 7001 07/22/22 0814   07/22/22 0600  metroNIDAZOLE (FLAGYL) IVPB 500 mg       See Hyperspace for full Linked Orders Report.   500 mg 100 mL/hr over 60 Minutes Intravenous On call to O.R. 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 - Regular diet - Multimodal pain control - Ambulate TID, pulmonary toilet, encouraged more use of IS today - No insulin requirements, discontinue glucose checks - Leukocytosis: Secondary to asplenia, no other signs of sepsis - Keep drain in place, amylase pending. - VTE: lovenox, SCDs - Dispo: inpatient, med-surg floor. Anticipate discharge home tomorrow if pain and nausea improve.    LOS: 3 days    Michaelle Birks, MD [facility] Surgery General, Hepatobiliary and Pancreatic Surgery 07/25/22 6:42 AM

## 2022-07-25 NOTE — Plan of Care (Signed)
  Problem: Clinical Measurements: Goal: Will remain free from infection Outcome: Progressing   Problem: Clinical Measurements: Goal: Diagnostic test results will improve Outcome: Progressing   Problem: Activity: Goal: Risk for activity intolerance will decrease Outcome: Progressing   Problem: Nutrition: Goal: Adequate nutrition will be maintained Outcome: Progressing   Problem: Coping: Goal: Level of anxiety will decrease Outcome: Progressing   Problem: Pain Managment: Goal: General experience of comfort will improve Outcome: Progressing

## 2022-07-25 NOTE — Progress Notes (Signed)
Both of pts PIVs infiltrated so they were DC'd and switched all pain meds to oral route per Dr. Rosendo Gros order. Mother demonstrated correct technique by return demonstration how to empty the JP drain and measure. We reviewed how to measure and to keep a record to take to the follow up appt. Pt has been ambulating in the hallway independently and using her IS up to 1000-1250. Pt showered today, covering her JP drain and lap/incision sites. Pt eating regular food with good toleration, has been passing gas but no BM yet.

## 2022-07-25 NOTE — Discharge Instructions (Addendum)
[facility] DISCHARGE INSTRUCTIONS  Activity No heavy lifting greater than 15 pounds for 6 weeks after surgery. Ok to shower, but do not bathe or submerge incision underwater. Do not drive while taking narcotic pain medication. Be sure to walk around your home at least 3 times daily. This will help avoid blood clots and will improve your strength.  Wound Care Your incisions are covered with skin glue called Dermabond. This will peel off on its own over time. You may shower and allow warm soapy water to run over your incisions. Gently pat dry. Do not submerge your incision underwater. Monitor your incision for any new redness, tenderness, or drainage.  Medications You should take Tylenol 4 times daily for pain. If you have more severe pain that is not covered by Tylenol, you may take oxycodone up to every 6 hours as needed. Please reserve this medication only for the most severe pain. You may take stool softeners and Miralax to prevent constipation. Be sure to drink plenty of fluids to stay hydrated.  Diet You may notice that you feel full early after meals, or have occasional nausea or decreased appetite. This is common following pancreas surgery and will improve with time. If you feel full early after meals, it is better to consume small frequent meals throughout the day rather than 3 large meals. If you are having frequent vomiting after meals or are unable to keep down any food or drink, please call the office right away.  JP DRAIN CARE INSTRUCTIONS Wash your hands prior to caring for your drain. Uncap the bulb to release the suction. Pour out the bulb contents into a measuring cup and record the amount. Squeeze the bulb and replace the cap to apply suction again. You should empty your drain each time you notice the bulb is full. Empty at least once a day and more often when the bulb fills up.  Please keep a daily log of your drain output and bring this with you when  you come to clinic.   When to Call us: Fever greater than 100.5 New redness, drainage, or swelling at incision site Severe pain, nausea, or vomiting Excessive vomiting or inability to keep down any liquids Jaundice (yellowing of the whites of the eyes or skin)  Follow-up You have an appointment scheduled with Dr. Zenia Resides on August 10, 2021 at 9:20am. This will be at the [facility] office at [address] [place], [place], [place]. Please arrive at least 15 minutes prior to your scheduled appointment time.  For questions or concerns, please call the office at [phone].

## 2022-07-26 LAB — SURGICAL PATHOLOGY

## 2022-07-26 LAB — CBC
HCT: 36.8 % (ref 36.0–46.0)
Hemoglobin: 12.3 g/dL (ref 12.0–15.0)
MCH: 29.8 pg (ref 26.0–34.0)
MCHC: 33.4 g/dL (ref 30.0–36.0)
MCV: 89.1 fL (ref 80.0–100.0)
Platelets: 419 10*3/uL — ABNORMAL HIGH (ref 150–400)
RBC: 4.13 MIL/uL (ref 3.87–5.11)
RDW: 12.5 % (ref 11.5–15.5)
WBC: 22.5 10*3/uL — ABNORMAL HIGH (ref 4.0–10.5)
nRBC: 0 % (ref 0.0–0.2)

## 2022-07-26 MED ORDER — METHOCARBAMOL 750 MG PO TABS
750.0000 mg | ORAL_TABLET | Freq: Four times a day (QID) | ORAL | Status: DC
  Administered 2022-07-26 – 2022-07-27 (×5): 750 mg via ORAL
  Filled 2022-07-26 (×5): qty 1

## 2022-07-26 NOTE — Progress Notes (Signed)
    4 Days Post-Op  Subjective: Patient reports more pain this morning, primarily at LUQ incisions and left shoulder. Had a low grade fever of 38 overnight. Tolerating regular diet, endorses some nausea but no vomiting.   Objective: Vital signs in last 24 hours: Temp:  [98.2 F (36.8 C)-100.4 F (38 C)] 98.3 F (36.8 C) (12/18 0618) Pulse Rate:  [97-104] 97 (12/18 0618) Resp:  [17-18] 18 (12/17 1603) BP: (117-138)/(64-80) 138/80 (12/18 0618) SpO2:  [95 %-98 %] 95 % (12/18 0618) Last BM Date :  (PTA)  Intake/Output from previous day: 12/17 0701 - 12/18 0700 In: 720 [P.O.:720] Out: -  Intake/Output this shift: Total I/O In: 240 [P.O.:240] Out: -   PE: General: resting comfortably, NAD Neuro: alert and oriented, no focal deficits Resp: normal work of breathing on room air Abdomen: soft, nondistended, nontender to palpation. Incisions clean and dry with mild ecchymosis, no erythema or induration. LUQ JP with serosanguinous drainage. Extremities: warm and well-perfused   Lab Results:  Recent Labs    07/24/22 0202 07/25/22 0150  WBC 29.0* 25.8*  HGB 11.1* 11.2*  HCT 32.6* 33.2*  PLT 303 300   BMET Recent Labs    07/24/22 0202 07/25/22 0150  NA 138 137  K 3.8 3.5  CL 108 105  CO2 22 23  GLUCOSE 96 90  BUN 6 6  CREATININE 0.64 0.68  CALCIUM 8.3* 8.4*   PT/INR No results for input(s): "LABPROT", "INR" in the last 72 hours. CMP     Component Value Date/Time   NA 137 07/25/2022 0150   K 3.5 07/25/2022 0150   CL 105 07/25/2022 0150   CO2 23 07/25/2022 0150   GLUCOSE 90 07/25/2022 0150   BUN 6 07/25/2022 0150   CREATININE 0.68 07/25/2022 0150   CALCIUM 8.4 (L) 07/25/2022 0150   PROT 7.5 05/28/2022 1400   ALBUMIN 4.3 05/28/2022 1400   AST 30 05/28/2022 1400   ALT 29 05/28/2022 1400   ALKPHOS 35 (L) 05/28/2022 1400   BILITOT 1.0 05/28/2022 1400   GFRNONAA >60 07/25/2022 0150   Lipase     Component Value Date/Time   LIPASE 42 05/28/2022 1400        Studies/Results: No results found.  Anti-infectives: Anti-infectives (From admission, onward)    Start     Dose/Rate Route Frequency Ordered Stop   07/22/22 0600  ceFAZolin (ANCEF) IVPB 3g/100 mL premix       See Hyperspace for full Linked Orders Report.   3 g 200 mL/hr over 30 Minutes Intravenous On call to O.R. 07/22/22 2951 07/22/22 0814   07/22/22 0600  metroNIDAZOLE (FLAGYL) IVPB 500 mg       See Hyperspace for full Linked Orders Report.   500 mg 100 mL/hr over 60 Minutes Intravenous On call to O.R. 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 - Regular diet - Multimodal pain control - Ambulate TID, pulmonary toilet, IS - Check CBC this morning given increased pain and low grade fever overnight. Suspect fever is secondary to atelectasis. - Keep drain in place, amylase level pending. - VTE: lovenox, SCDs - Dispo: inpatient, med-surg floor. Will not discharge today due to increased pain and fever.    LOS: 4 days    Michaelle Birks, MD [facility] Surgery General, Hepatobiliary and Pancreatic Surgery 07/26/22 6:41 AM

## 2022-07-27 ENCOUNTER — Inpatient Hospital Stay (HOSPITAL_COMMUNITY): Payer: 59

## 2022-07-27 ENCOUNTER — Other Ambulatory Visit (HOSPITAL_COMMUNITY): Payer: Self-pay

## 2022-07-27 LAB — AMYLASE, BODY FLUID (OTHER): Amylase, Body Fluid: 759 U/L

## 2022-07-27 MED ORDER — OXYCODONE HCL 5 MG PO TABS
5.0000 mg | ORAL_TABLET | ORAL | 0 refills | Status: DC | PRN
  Filled 2022-07-27: qty 20, 4d supply, fill #0

## 2022-07-27 MED ORDER — METHOCARBAMOL 750 MG PO TABS
750.0000 mg | ORAL_TABLET | Freq: Four times a day (QID) | ORAL | 0 refills | Status: DC | PRN
  Filled 2022-07-27: qty 20, 5d supply, fill #0

## 2022-07-27 MED ORDER — ONDANSETRON 4 MG PO TBDP
4.0000 mg | ORAL_TABLET | Freq: Four times a day (QID) | ORAL | 0 refills | Status: DC | PRN
  Filled 2022-07-27: qty 20, 5d supply, fill #0

## 2022-07-27 MED ORDER — DOCUSATE SODIUM 100 MG PO CAPS: 100.0000 mg | ORAL_CAPSULE | Freq: Two times a day (BID) | ORAL | 0 refills | Status: DC

## 2022-07-27 MED ORDER — MENINGOCOCCAL VAC B (OMV) IM SUSY
0.5000 mL | PREFILLED_SYRINGE | Freq: Once | INTRAMUSCULAR | Status: AC
  Administered 2022-07-27: 0.5 mL via INTRAMUSCULAR
  Filled 2022-07-27: qty 0.5

## 2022-07-27 MED ORDER — MENINGOCOCCAL A C Y&W-135 OLIG IM SOLR
0.5000 mL | Freq: Once | INTRAMUSCULAR | Status: AC
  Administered 2022-07-27: 0.5 mL via INTRAMUSCULAR
  Filled 2022-07-27: qty 0.5

## 2022-07-27 NOTE — Progress Notes (Signed)
    5 Days Post-Op  Subjective: Tm of 38.1 yesterday evening, afebrile since then. WBC yesterday downtrending. Patient says she overall feels much better today and wants to go home. Using IS and pulling 1250. She does report intermittent left shoulder pain, radiates down left side but not the arm. No limitation on range of motion.    Objective: Vital signs in last 24 hours: Temp:  [97.9 F (36.6 C)-100.6 F (38.1 C)] 97.9 F (36.6 C) (12/19 0625) Pulse Rate:  [70-109] 70 (12/19 0625) Resp:  [17-18] 18 (12/19 0625) BP: (116-138)/(76-80) 131/80 (12/19 0625) SpO2:  [94 %-99 %] 99 % (12/19 0625) Last BM Date :  (PTA)  Intake/Output from previous day: 12/18 0701 - 12/19 0700 In: 720 [P.O.:720] Out: 10 [Drains:10] Intake/Output this shift: No intake/output data recorded.  PE: General: resting comfortably, NAD Neuro: alert and oriented, no focal deficits Resp: normal work of breathing on room air Abdomen: soft, nondistended, nontender to palpation. Incisions clean and dry with no erythema or induration. LUQ JP with thin brown drainage. Extremities: warm and well-perfused   Lab Results:  Recent Labs    07/25/22 0150 07/26/22 0701  WBC 25.8* 22.5*  HGB 11.2* 12.3  HCT 33.2* 36.8  PLT 300 419*   BMET Recent Labs    07/25/22 0150  NA 137  K 3.5  CL 105  CO2 23  GLUCOSE 90  BUN 6  CREATININE 0.68  CALCIUM 8.4*   PT/INR No results for input(s): "LABPROT", "INR" in the last 72 hours. CMP     Component Value Date/Time   NA 137 07/25/2022 0150   K 3.5 07/25/2022 0150   CL 105 07/25/2022 0150   CO2 23 07/25/2022 0150   GLUCOSE 90 07/25/2022 0150   BUN 6 07/25/2022 0150   CREATININE 0.68 07/25/2022 0150   CALCIUM 8.4 (L) 07/25/2022 0150   PROT 7.5 05/28/2022 1400   ALBUMIN 4.3 05/28/2022 1400   AST 30 05/28/2022 1400   ALT 29 05/28/2022 1400   ALKPHOS 35 (L) 05/28/2022 1400   BILITOT 1.0 05/28/2022 1400   GFRNONAA >60 07/25/2022 0150   Lipase      Component Value Date/Time   LIPASE 42 05/28/2022 1400       Studies/Results: No results found.      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 - Regular diet - Multimodal pain control - Ambulate TID, pulmonary toilet, IS - Afebrile today. Suspect low grade temp of 38 secondary to atelectasis, patient has no concerning symptoms, appears clinically well and has no other signs of infection. - Drain amylase pending, output today appears consistent with pancreatic fluid. Patient is to go home with drain in place, will need drain teaching. - L shoulder pain: possibly due to intra-op position, does not limit ROM. Will check XR this morning prior to discharge. - Immunization record reviewed. Meningococcal vaccines given in 2019, will give booster doses today prior to discharge. Other vaccines are up to date. - VTE: lovenox, SCDs - Dispo: discharge home    LOS: 5 days    Michaelle Birks, MD [facility] Surgery General, Hepatobiliary and Pancreatic Surgery 07/27/22 7:52 AM

## 2022-07-27 NOTE — Progress Notes (Signed)
RN gave patient Discharge instructions and she stated understanding, mother at bedside and stated understanding as well, pt and mother taught drain care and how to change dressing if soiled.

## 2022-07-28 LAB — TYPE AND SCREEN
ABO/RH(D): A POS
Antibody Screen: NEGATIVE
Unit division: 0
Unit division: 0

## 2022-07-28 LAB — BPAM RBC
Blood Product Expiration Date: 202401122359
Blood Product Expiration Date: 202401122359
Unit Type and Rh: 6200
Unit Type and Rh: 6200

## 2022-07-28 NOTE — Discharge Summary (Signed)
Physician Discharge Summary   Patient ID: Kiara Harris [id number] 21 y.o. April 05, 2001  Admit date: 07/22/2022  Discharge date and time: 07/27/2022 12:05 PM   Admitting Physician: Dwan Bolt, MD   Discharge Physician: Michaelle Birks, MD  Admission Diagnoses: Pancreatic mucinous cystic neoplasm  Discharge Diagnoses: Same  Admission Condition: good  Discharged Condition: good  Indication for Admission: Kiara Harris is a 21 yo female who was referred with a 4cm cystic mass in the tail of the pancreas, which was diagnosed incidentally on MRI. She underwent an EUS with FNA, which was consistent with a mucinous cystic neoplasm. After a discussion of the risks and benefits of surgery, she agreed to proceed with distal pancreatectomy and presented for elective surgery on 07/22/22.  Hospital Course: The patient was taken to the OR on 12/14 for a laparoscopic distal pancreatectomy with splenectomy. For further details of this procedure please see separately dictated operative note. Postoperatively she was admitted to the med-surg floor in stable condition. Foley was removed on POD1 and she was able to void. She was advanced to a clear liquid diet and then a regular diet, which she tolerated without difficulty. POD3 drain amylase was elevated at 759, and her surgical drain was left in place. She had a low grade fever of 38 on the evening of POD3 and POD4, which resolved spontaneously with no other symptoms. Her WBC was elevated postop secondary to asplenia and was downtrending on POD4. On the morning of POD5, her pain was well-controlled, she was ambulating independently, and she was tolerating a regular diet with no nausea or vomiting. She was examined and deemed appropriate for discharge home. Her drain is to remain in place at discharge.  Consults: None  Significant Diagnostic Studies:  Surgical Pathology: A. PANCREAS, DISTAL WITH SPLEEN, PANCREATECTOMY:  - Multiloculated mucinous cystic  neoplasm, 4.7 cm, low-grade, involving  distal pancreas  - Negative for high-grade dysplasia or carcinoma  - Resection margins are negative for tumor  - Three benign lymph nodes  - Benign unremarkable spleen and a splenule   Treatments: analgesia: acetaminophen, Dilaudid, robaxin, toradol and oxycodone; and surgery: Laparoscopic distal pancreatectomy with splenectomy  Discharge Exam: General: resting comfortably, NAD Neuro: alert and oriented, no focal deficits Resp: normal work of breathing on room air Abdomen: soft, nondistended, nontender to palpation. Incisions clean and dry with no erythema or induration. LUQ JP with thin brown drainage. Extremities: warm and well-perfused   Disposition: Discharge disposition: 01-Home or Self Care       Patient Instructions:  Allergies as of 07/27/2022       Reactions   Doxycycline Hyclate Nausea And Vomiting   Doxycycline Nausea And Vomiting   Latex Itching   With prolonged exposure=itching.   Amoxil [amoxicillin] Rash   Multihance [gadobenate] Itching   Hands and throat itching after MRI scan on 08/07/21. Pt would need 13 hr prep prior to MRI contrast. - Dr. Logan Bores.         Medication List     TAKE these medications    acetaminophen 325 MG tablet Commonly known as: TYLENOL Take 650 mg by mouth every 6 (six) hours as needed for moderate pain.   albuterol 108 (90 Base) MCG/ACT inhaler Commonly known as: VENTOLIN HFA Inhale 1-2 puffs into the lungs every 6 (six) hours as needed for wheezing or shortness of breath.   buPROPion ER 100 MG 12 hr tablet Commonly known as: WELLBUTRIN SR Take 100 mg by mouth daily as needed (depression).  Butalbital-APAP-Caffeine 50-300-40 MG Caps Take 1 capsule by mouth every 8 (eight) hours as needed (headache).   dicyclomine 20 MG tablet Commonly known as: BENTYL Take 20 mg by mouth 2 (two) times daily as needed for spasms or pain.   docusate sodium 100 MG capsule Commonly known  as: COLACE Take 1 capsule (100 mg total) by mouth 2 (two) times daily.   meloxicam 7.5 MG tablet Commonly known as: MOBIC Take 7.5 mg by mouth 2 (two) times daily as needed for pain.   methocarbamol 750 MG tablet Commonly known as: ROBAXIN Take 1 tablet (750 mg total) by mouth every 6 (six) hours as needed for muscle spasms.   ondansetron 4 MG disintegrating tablet Commonly known as: ZOFRAN-ODT Take 4 mg by mouth every 8 (eight) hours as needed for vomiting or nausea. What changed: Another medication with the same name was added. Make sure you understand how and when to take each.   ondansetron 4 MG disintegrating tablet Commonly known as: ZOFRAN-ODT Take 1 tablet (4 mg total) by mouth every 6 (six) hours as needed for nausea or vomiting. What changed: You were already taking a medication with the same name, and this prescription was added. Make sure you understand how and when to take each.   oxyCODONE 5 MG immediate release tablet Commonly known as: Oxy IR/ROXICODONE Take 1-2 tablets (5-10 mg total) by mouth every 4 (four) hours as needed for severe pain.   pantoprazole 40 MG tablet Commonly known as: PROTONIX Take 40 mg by mouth 2 (two) times daily as needed (acid reflux/indigestion).   Phexxi 1.8-1-0.4 % Gel Generic drug: Lactic Ac-Citric Ac-Pot Bitart Place 5 g vaginally as directed. Up to 1 hour before intercourse       Activity: no driving while on analgesics and no heavy lifting for 6 weeks Diet: regular diet Wound Care: keep wound clean and dry  Follow-up with Dr. Zenia Resides on 08/10/21. Will have phone follow up in 1 week.  Signed: Dwan Bolt 07/28/2022 7:58 AM

## 2022-08-16 ENCOUNTER — Telehealth: Payer: Self-pay

## 2022-08-16 ENCOUNTER — Emergency Department (HOSPITAL_BASED_OUTPATIENT_CLINIC_OR_DEPARTMENT_OTHER): Payer: 59

## 2022-08-16 ENCOUNTER — Encounter (HOSPITAL_BASED_OUTPATIENT_CLINIC_OR_DEPARTMENT_OTHER): Payer: Self-pay | Admitting: Emergency Medicine

## 2022-08-16 ENCOUNTER — Emergency Department (HOSPITAL_BASED_OUTPATIENT_CLINIC_OR_DEPARTMENT_OTHER)
Admission: EM | Admit: 2022-08-16 | Discharge: 2022-08-16 | Disposition: A | Discharge status: Home / Self Care | Payer: 59 | Attending: Emergency Medicine | Admitting: Emergency Medicine

## 2022-08-16 ENCOUNTER — Other Ambulatory Visit: Payer: Self-pay

## 2022-08-16 DIAGNOSIS — Z91040 Latex allergy status: Secondary | ICD-10-CM | POA: Diagnosis not present

## 2022-08-16 DIAGNOSIS — N838 Other noninflammatory disorders of ovary, fallopian tube and broad ligament: Secondary | ICD-10-CM

## 2022-08-16 DIAGNOSIS — R102 Pelvic and perineal pain: Secondary | ICD-10-CM | POA: Diagnosis present

## 2022-08-16 DIAGNOSIS — R1030 Lower abdominal pain, unspecified: Secondary | ICD-10-CM

## 2022-08-16 DIAGNOSIS — N839 Noninflammatory disorder of ovary, fallopian tube and broad ligament, unspecified: Secondary | ICD-10-CM | POA: Insufficient documentation

## 2022-08-16 LAB — COMPREHENSIVE METABOLIC PANEL
ALT: 35 U/L (ref 0–44)
AST: 23 U/L (ref 15–41)
Albumin: 4.2 g/dL (ref 3.5–5.0)
Alkaline Phosphatase: 39 U/L (ref 38–126)
Anion gap: 10 (ref 5–15)
BUN: 13 mg/dL (ref 6–20)
CO2: 24 mmol/L (ref 22–32)
Calcium: 9.4 mg/dL (ref 8.9–10.3)
Chloride: 104 mmol/L (ref 98–111)
Creatinine, Ser: 0.68 mg/dL (ref 0.44–1.00)
GFR, Estimated: >60 mL/min (ref >60)
Glucose, Bld: 99 mg/dL (ref 70–99)
Potassium: 3.5 mmol/L (ref 3.5–5.1)
Sodium: 138 mmol/L (ref 135–145)
Total Bilirubin: 0.3 mg/dL (ref 0.3–1.2)
Total Protein: 7.2 g/dL (ref 6.5–8.1)

## 2022-08-16 LAB — URINALYSIS, ROUTINE W REFLEX MICROSCOPIC
Bilirubin Urine: NEGATIVE
Glucose, UA: NEGATIVE mg/dL
Hgb urine dipstick: NEGATIVE
Ketones, ur: NEGATIVE mg/dL
Leukocytes,Ua: NEGATIVE
Nitrite: NEGATIVE
Specific Gravity, Urine: 1.036 — ABNORMAL HIGH (ref 1.005–1.030)
pH: 6 (ref 5.0–8.0)

## 2022-08-16 LAB — CBC WITH DIFFERENTIAL/PLATELET
Abs Immature Granulocytes: 0.04 10*3/uL (ref 0.00–0.07)
Basophils Absolute: 0.1 10*3/uL (ref 0.0–0.1)
Basophils Relative: 1 %
Eosinophils Absolute: 0.3 10*3/uL (ref 0.0–0.5)
Eosinophils Relative: 2 %
HCT: 37.3 % (ref 36.0–46.0)
Hemoglobin: 12 g/dL (ref 12.0–15.0)
Immature Granulocytes: 0 %
Lymphocytes Relative: 28 %
Lymphs Abs: 3.6 10*3/uL (ref 0.7–4.0)
MCH: 28.7 pg (ref 26.0–34.0)
MCHC: 32.2 g/dL (ref 30.0–36.0)
MCV: 89.2 fL (ref 80.0–100.0)
Monocytes Absolute: 1.3 10*3/uL — ABNORMAL HIGH (ref 0.1–1.0)
Monocytes Relative: 10 %
Neutro Abs: 7.3 10*3/uL (ref 1.7–7.7)
Neutrophils Relative %: 59 %
Platelets: 799 10*3/uL — ABNORMAL HIGH (ref 150–400)
RBC: 4.18 MIL/uL (ref 3.87–5.11)
RDW: 13.1 % (ref 11.5–15.5)
WBC: 12.6 10*3/uL — ABNORMAL HIGH (ref 4.0–10.5)
nRBC: 0 % (ref 0.0–0.2)

## 2022-08-16 LAB — PREGNANCY, URINE: Preg Test, Ur: NEGATIVE

## 2022-08-16 MED ORDER — MORPHINE SULFATE (PF) 2 MG/ML IV SOLN
2.0000 mg | Freq: Once | INTRAVENOUS | Status: AC
  Administered 2022-08-16: 2 mg via INTRAVENOUS
  Filled 2022-08-16: qty 1

## 2022-08-16 MED ORDER — ONDANSETRON HCL 4 MG/2ML IJ SOLN
4.0000 mg | Freq: Once | INTRAMUSCULAR | Status: AC | PRN
  Administered 2022-08-16: 4 mg via INTRAVENOUS
  Filled 2022-08-16: qty 2

## 2022-08-16 MED ORDER — HYDROCODONE-ACETAMINOPHEN 5-325 MG PO TABS: 1.0000 | ORAL_TABLET | Freq: Four times a day (QID) | ORAL | 0 refills | Status: DC | PRN

## 2022-08-16 MED ORDER — IOHEXOL 300 MG/ML  SOLN
100.0000 mL | Freq: Once | INTRAMUSCULAR | Status: AC | PRN
  Administered 2022-08-16: 100 mL via INTRAVENOUS

## 2022-08-16 NOTE — Telephone Encounter (Signed)
ER or urgent care recommended. Based on severity of pain she needs to be seen urgently.

## 2022-08-16 NOTE — Telephone Encounter (Signed)
Spoke with patient and informed her Tw's recommendation. I told her [place] at [facility] had the least wait with census this morning (of all the [facility] locally).  I explained that they could provide imaging if needed and would have access to all her medical records from her surgery, etc.  She voiced and understanding and agreed.

## 2022-08-16 NOTE — Discharge Instructions (Addendum)
Thank you for allowing me to be part of your care today.  Your CT scan showed a mass on the right side, likely on your ovary that appears to by cystic.  I recommend following up with your gynecologist as soon as possible to discuss CT results and for any further treatment/management.    I have sent a prescription for pain medicine to your pharmacy to help with severe pain.  I recommend alternating Tylenol and ibuprofen every 3-4 hours as needed for mild-moderate pain.  You may take 500-'1000mg'$  Tylenol at a time and 600-'800mg'$  ibuprofen at a time.  Do not exceed '4000mg'$  Tylenol in a 24 hour period.  Do not exceed '3200mg'$  ibuprofen in a 24 hour period.    Return to the ER if you develop worsening of your symptoms or have any new concerns.

## 2022-08-16 NOTE — ED Triage Notes (Signed)
Pt arrives to ED with c/o pelvic pain x3 days.

## 2022-08-16 NOTE — ED Provider Notes (Signed)
[facility] Provider Note   CSN: [id number] Arrival date & time: 08/16/22  1545     History  Chief Complaint  Patient presents with   Pelvic Pain    [place] is a 22 y.o. female presents to the ED complaining of lower abdominal pain and pelvic pain that started 3 days ago.  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  Patient was to get drain removed on 08/10/22 but was still having 20-40 mL/ day output.  She states since her pain began she has had increased output into her drain, but it is still pale yellow and not cloudy or red in color.  She does have history significant for PCOS, but states her pelvic pain feels different.  She was unsure if it is related to recent surgery and was instructed by gynecology to be evaluated at ED due to likely need for blood work and imaging.  Denies vaginal discharge, bleeding, diarrhea, constipation, abdominal distension, fever, chills, dysuria, difficulty urinating or increased frequency, flank pain.  Denies concern for STI, has not been sexually active since her surgery.         Home Medications Prior to Admission medications   Medication Sig Start Date End Date Taking? Authorizing Provider  acetaminophen (TYLENOL) 325 MG tablet Take 650 mg by mouth every 6 (six) hours as needed for moderate pain.    [provider]  albuterol (VENTOLIN HFA) 108 (90 Base) MCG/ACT inhaler Inhale 1-2 puffs into the lungs every 6 (six) hours as needed for wheezing or shortness of breath.    [provider]  buPROPion ER (WELLBUTRIN SR) 100 MG 12 hr tablet Take 100 mg by mouth daily as needed (depression). 04/03/22   [provider]  dicyclomine (BENTYL) 20 MG tablet Take 20 mg by mouth 2 (two) times daily as needed for spasms or pain. 04/23/22   [provider]  docusate sodium (COLACE) 100 MG capsule Take 1 capsule (100 mg  total) by mouth 2 (two) times daily. 07/27/22   Dwan Bolt, MD  HYDROcodone-acetaminophen (NORCO/VICODIN) 5-325 MG tablet Take 1-2 tablets by mouth every 6 (six) hours as needed for severe pain. 08/16/22  Yes Tonye Tancredi R, PA  Lactic Ac-Citric Ac-Pot Bitart (PHEXXI) 1.8-1-0.4 % GEL Place 5 g vaginally as directed. Up to 1 hour before intercourse 07/21/22   Chrzanowski, Jami B, NP  meloxicam (MOBIC) 7.5 MG tablet Take 7.5 mg by mouth 2 (two) times daily as needed for pain. 03/23/22   [provider]  methocarbamol (ROBAXIN) 750 MG tablet Take 1 tablet (750 mg total) by mouth every 6 (six) hours as needed for muscle spasms. 07/27/22   Dwan Bolt, MD  ondansetron (ZOFRAN-ODT) 4 MG disintegrating tablet Take 4 mg by mouth every 8 (eight) hours as needed for vomiting or nausea. 05/16/20   [provider]  ondansetron (ZOFRAN-ODT) 4 MG disintegrating tablet Take 1 tablet (4 mg total) by mouth every 6 (six) hours as needed for nausea or vomiting. 07/27/22   Dwan Bolt, MD  pantoprazole (PROTONIX) 40 MG tablet Take 40 mg by mouth 2 (two) times daily as needed (acid reflux/indigestion). 09/04/20   [provider]      Allergies    Doxycycline hyclate, Doxycycline, Latex, Amoxil [amoxicillin], and Multihance [gadobenate]    Review of Systems   Review of Systems  Constitutional:  Negative for chills and fever.  Gastrointestinal:  Positive for abdominal pain and nausea. Negative for abdominal distention, constipation, diarrhea and vomiting.  Genitourinary:  Positive for pelvic pain and vaginal pain. Negative for difficulty urinating, dysuria, flank pain, frequency, vaginal bleeding and vaginal discharge.    Physical Exam Updated Vital Signs BP 111/60 (BP Location: Right Arm)   Pulse 60   Temp 98.7 F (37.1 C) (Oral)   Resp 12   Ht 5' 5.5" (1.664 m)   Wt 122.5 kg   SpO2 100%   BMI 44.25 kg/m  Physical Exam Vitals and nursing note reviewed.  Constitutional:       General: She is not in acute distress.    Appearance: She is not ill-appearing.  HENT:     Mouth/Throat:     Mouth: Mucous membranes are moist.     Pharynx: Oropharynx is clear.  Cardiovascular:     Rate and Rhythm: Normal rate and regular rhythm.     Pulses: Normal pulses.     Heart sounds: Normal heart sounds.  Pulmonary:     Effort: Pulmonary effort is normal. No respiratory distress.     Breath sounds: Normal breath sounds and air entry.  Abdominal:     General: Abdomen is flat. Bowel sounds are normal. There is no distension.     Palpations: Abdomen is soft.     Tenderness: There is abdominal tenderness in the right lower quadrant, suprapubic area and left lower quadrant.     Comments: Tenderness to palpation of lower abdominal quadrants, R > L; patient also states she has generalized abdominal pain when taking deep breaths.  Healing surgical incision sites.  JP drain insertion site to LUQ covered with gauze.  Gauze appears clean.    Skin:    General: Skin is warm and dry.     Capillary Refill: Capillary refill takes less than 2 seconds.  Neurological:     Mental Status: She is alert. Mental status is at baseline.  Psychiatric:        Mood and Affect: Mood normal.        Behavior: Behavior normal.     ED Results / Procedures / Treatments   Labs (all labs ordered are listed, but only abnormal results are displayed) Labs Reviewed  URINALYSIS, ROUTINE W REFLEX MICROSCOPIC - Abnormal; Notable for the following components:      Result Value   Specific Gravity, Urine 1.036 (*)    Protein, ur TRACE (*)    All other components within normal limits  CBC WITH DIFFERENTIAL/PLATELET - Abnormal; Notable for the following components:   WBC 12.6 (*)    Platelets 799 (*)    Monocytes Absolute 1.3 (*)    All other components within normal limits  PREGNANCY, URINE  COMPREHENSIVE METABOLIC PANEL    EKG EKG Interpretation  Date/Time:  Monday August 16 2022 18:35:47  EST Ventricular Rate:  60 PR Interval:  142 QRS Duration: 115 QT Interval:  419 QTC Calculation: 419 R Axis:   86 Text Interpretation: Sinus arrhythmia Nonspecific intraventricular conduction delay Confirmed by Fredia Sorrow [phone]) on 08/16/2022 6:41:23 PM  Radiology CT ABDOMEN PELVIS W CONTRAST  Result Date: 08/16/2022 CLINICAL DATA:  Abdominal pain. Postop day 5 of distal pancreatectomy and splenectomy. Mucinous cystic neoplasm of the pancreas. EXAM: CT ABDOMEN AND PELVIS WITH CONTRAST TECHNIQUE: Multidetector CT imaging of the abdomen and pelvis was performed using the standard protocol following bolus administration of intravenous contrast. RADIATION DOSE REDUCTION: This exam was performed according to the departmental dose-optimization program which includes  automated exposure control, adjustment of the mA and/or kV according to patient size and/or use of iterative reconstruction technique. CONTRAST:  148m OMNIPAQUE IOHEXOL 300 MG/ML  SOLN COMPARISON:  MRI 01/19/2022 and older FINDINGS: Lower chest: Small left pleural effusion with some adjacent opacity. Right lung bases clear. Hepatobiliary: No enhancing liver lesion. Gallbladder is nondilated. Patent portal vein. Pancreas: There has been interval surgical resection of the cystic lesion along the tail of the pancreas. Suture lines identified this location extending to the margin of the greater curvature of the stomach. There is stranding in this location with some ill-defined fluid. Spleen: Associated splenectomy. Thickening tracking along the anterior perinephric space. Adrenals/Urinary Tract: The adrenal glands the kidneys are unremarkable. No collecting system dilatation of the kidneys. Underdistended urinary bladder with a preserved contour. Stomach/Bowel: On this non oral contrast exam, large bowel has a normal course and caliber with scattered stool. Cecum resides in the central pelvis above the bladder. Normal retrocecal appendix extending  superior. Moderate debris in the stomach. Small bowel is nondilated. Vascular/Lymphatic: Normal caliber aorta and IVC. Retroaortic left renal vein. Again there is some retroperitoneal stranding. No specific abnormal lymph node enlargement present in the abdomen and pelvis. Reproductive: Uterus is present. There is a cystic lesion in the right adnexa measuring 3.4 by 2.4 cm. Likely ovarian. Based on size, appearance and patient's age no imaging follow-up is recommended. Trace simple free fluid in the pelvis. Other: There is a drain entering the left mid abdomen and coursing down into the pelvis and then back into the left upper quadrant just caudal to the surgical bed. Musculoskeletal: Schmorl's node changes along the lower thoracic and upper lumbar spine. Slight curvature of the lumbar spine. Slight sclerosis along the margin of the sacroiliac joints along the iliac portions. IMPRESSION: Interval surgical changes with splenectomy and distal tail pancreatectomy. The previous complex cystic mass is no longer identified along the tail of the pancreas. There is surrounding stranding, ill-defined fluid. No well-defined fluid collections. Trace simple free fluid in the pelvis. Indwelling drain. Small left pleural effusion with some adjacent opacity. Cystic lesion along the right adnexa, likely ovarian measuring 3.4 cm. Based on size, appearance and the patient's age no specific imaging follow-up recommended in the absence of symptoms. No follow-up imaging recommended. Note: This recommendation does not apply to premenarchal patients and to those with increased risk (genetic, family history, elevated tumor markers or other high-risk factors) of ovarian cancer. Reference: JACR 2020 Feb; 17(2):248-254 Electronically Signed   By: AJill SideM.D.   On: 08/16/2022 17:48    Procedures Procedures    Medications Ordered in ED Medications  iohexol (OMNIPAQUE) 300 MG/ML solution 100 mL (100 mLs Intravenous Contrast Given  08/16/22 1721)  morphine (PF) 2 MG/ML injection 2 mg (2 mg Intravenous Given 08/16/22 1733)  ondansetron (ZOFRAN) injection 4 mg (4 mg Intravenous Given 08/16/22 1733)    ED Course/ Medical Decision Making/ A&P                           Medical Decision Making Amount and/or Complexity of Data Reviewed Labs: ordered. Radiology: ordered.  Risk Prescription drug management.   This patient presents to the ED with chief complaint(s) of lower abdominal pain with pertinent past medical history of recent distal pancreatectomy and splenectomy due to cystic mass, PCOS.  The complaint involves an extensive differential diagnosis and also carries with it a high risk of complications and morbidity.  The differential diagnosis includes post-surgical complication or infection, ovarian mass/cyst, endometriosis, menses related pain, Mittelschmerz   The initial plan is to obtain baseline labs, UA and CT abdomen/pelvis  Additional history obtained: Additional history obtained from  patient's mom at bedside Records reviewed  surgical records and post-op notes as well as telephone encounter with gynecology  Initial Assessment:   Exam significant for tenderness to palpation of lower abdomen R > L and in suprapubic region.  There are well healing surgical incision sites without erythema or drainage.  Patient has JP drain with gauze covering insertion site to LUQ.  There is clear-yellow fluid present in the drain bulb.  Skin is warm and dry.  Heart rate is normal with regular rhythm.  Lungs clear to auscultation bilaterally.  Patient endorses increased abdominal pain with deep breathing.   Independent ECG/labs interpretation:  The following labs were independently interpreted:  CBC significant for mild leukocytosis, patient is afebrile and this may be reactive secondary to surgery.  Thrombocytosis also present with platelet count of 799, likely reactive secondary to splenectomy.   Metabolic panel reveals no  major electrolyte disturbances.  LFTs and kidney function within normal range.  Pregnancy test negative.   UA with mildly elevated specific gravity and trace protein.  No evidence of UTI or hematuria.    Independent visualization and interpretation of imaging: I independently visualized the following imaging with scope of interpretation limited to determining acute life threatening conditions related to emergency care: CT abdomen/pelvis, which revealed mild amount of free fluid around recent surgical site and a 3.4cm ovarian mass, likely a cyst.  I agree with radiologist interpretation.    Treatment and Reassessment: Patient's pain and nausea was treated.  Patient reports feeling improved.  Discussed CT results with patient and her mother at bedside.  Recommended Tylenol and ibuprofen as needed for mild-moderate pain.  Will prescribe pain medicine for a couple of days for severe pain.  Recommended follow-up with gynecology to discuss ovarian mass.    Obtained ECG due to patient HR being brady during reassessment around 48bpm.  HR improved to 60 at time of ECG.  ECG demonstrates sinus arrhythmia without evidence of ischemia.  Patient does not feel lightheaded, short of breath, and has no chest pain.  Bradycardia likely secondary to morphine.   Disposition:   The patient has been appropriately medically screened and/or stabilized in the ED. I have low suspicion for any other emergent medical condition which would require further screening, evaluation or treatment in the ED or require inpatient management. At time of discharge the patient is hemodynamically stable and in no acute distress. I have discussed work-up results and diagnosis with patient and answered all questions. Patient is agreeable with discharge plan. We discussed strict return precautions for returning to the emergency department and they verbalized understanding.   Prescription for pain medicine sent to patient's pharmacy to treat severe  pain related to ovarian mass.  Advised patient to follow-up with gynecology to discuss CT results.  Discussed supportive care measures for home including use of NSAIDs and heating pad.  Patient verbally expressed her understanding.            Final Clinical Impression(s) / ED Diagnoses Final diagnoses:  Ovarian mass, right  Lower abdominal pain    Rx / DC Orders ED Discharge Orders          Ordered    HYDROcodone-acetaminophen (NORCO/VICODIN) 5-325 MG tablet  Every 6 hours PRN  08/16/22 1903              Theressa Stamps R, PA 08/16/22 Drema Halon    Fredia Sorrow, MD 08/20/22 2035

## 2022-08-16 NOTE — Telephone Encounter (Signed)
Patient of Jami's.  Saw her first time ever 07/21/22.  Patient reports that she is a week before her period is to start. She called today stating that for the last 2-3 days she has had intermittent lower abd pain around her "uterus and ovaries".  It is excruciating from time to time.  She said she awoke this morning and was fine and went to classes but driving home it began and it hurt to push the brake. She came home and went to bed. If she lies really still with heating pad it will ease but as soon as she moves it returns.  Currently she rates her pain an 8 on the 1-10 pain scale.   She is fairly certain it happened last month the week before her period but she was post pancreatectomy and splenectomy and thought it was related.   Please advise.

## 2022-08-17 ENCOUNTER — Telehealth: Payer: Self-pay

## 2022-08-17 NOTE — Telephone Encounter (Signed)
Patient called yesterday afternoon with lower quadrant severe pain. TW recommended ER.  Patient was seen in ER and CT scan showed ovarian mass.  She was told to call her gyn to follow up. Mom said patient is still in "excruciating pain".  Please advise. Thanks

## 2022-08-17 NOTE — Telephone Encounter (Signed)
She has a 3.4cm simple cyst. No evidence of torsion on scan. They did not recommend follow up imaging.Please provide reassurance and I would recommend Tylenol and ibuprofen alternating along with a heat pack. Take the pain meds prescribed at her ER visit. If symptoms do not improve by the end of the week please let us know.

## 2022-08-17 NOTE — Telephone Encounter (Signed)
Per DPR access note on file I spoke with Mom, who had called, and relayed Jami's message to her.  She will relay to patient. Will call if not improved by end of week.

## 2022-09-10 ENCOUNTER — Other Ambulatory Visit: Payer: Self-pay | Admitting: Surgery

## 2022-09-10 ENCOUNTER — Emergency Department (HOSPITAL_BASED_OUTPATIENT_CLINIC_OR_DEPARTMENT_OTHER)
Admission: EM | Admit: 2022-09-10 | Discharge: 2022-09-10 | Disposition: A | Discharge status: Home / Self Care | Payer: 59 | Attending: Emergency Medicine | Admitting: Emergency Medicine

## 2022-09-10 ENCOUNTER — Other Ambulatory Visit: Payer: Self-pay

## 2022-09-10 ENCOUNTER — Emergency Department (HOSPITAL_BASED_OUTPATIENT_CLINIC_OR_DEPARTMENT_OTHER): Payer: 59

## 2022-09-10 DIAGNOSIS — R109 Unspecified abdominal pain: Secondary | ICD-10-CM | POA: Diagnosis present

## 2022-09-10 DIAGNOSIS — Z91040 Latex allergy status: Secondary | ICD-10-CM | POA: Diagnosis not present

## 2022-09-10 DIAGNOSIS — R1011 Right upper quadrant pain: Secondary | ICD-10-CM | POA: Diagnosis not present

## 2022-09-10 DIAGNOSIS — R110 Nausea: Secondary | ICD-10-CM | POA: Insufficient documentation

## 2022-09-10 DIAGNOSIS — J45909 Unspecified asthma, uncomplicated: Secondary | ICD-10-CM | POA: Diagnosis not present

## 2022-09-10 DIAGNOSIS — Z8507 Personal history of malignant neoplasm of pancreas: Secondary | ICD-10-CM | POA: Insufficient documentation

## 2022-09-10 LAB — CBC WITH DIFFERENTIAL/PLATELET
Abs Immature Granulocytes: 0.04 10*3/uL (ref 0.00–0.07)
Basophils Absolute: 0.1 10*3/uL (ref 0.0–0.1)
Basophils Relative: 1 %
Eosinophils Absolute: 0.2 10*3/uL (ref 0.0–0.5)
Eosinophils Relative: 2 %
HCT: 42.2 % (ref 36.0–46.0)
Hemoglobin: 13.8 g/dL (ref 12.0–15.0)
Immature Granulocytes: 1 %
Lymphocytes Relative: 29 %
Lymphs Abs: 2.4 10*3/uL (ref 0.7–4.0)
MCH: 28.9 pg (ref 26.0–34.0)
MCHC: 32.7 g/dL (ref 30.0–36.0)
MCV: 88.5 fL (ref 80.0–100.0)
Monocytes Absolute: 0.9 10*3/uL (ref 0.1–1.0)
Monocytes Relative: 11 %
Neutro Abs: 4.6 10*3/uL (ref 1.7–7.7)
Neutrophils Relative %: 56 %
Platelets: 634 10*3/uL — ABNORMAL HIGH (ref 150–400)
RBC: 4.77 MIL/uL (ref 3.87–5.11)
RDW: 13.9 % (ref 11.5–15.5)
WBC: 8.2 10*3/uL (ref 4.0–10.5)
nRBC: 0 % (ref 0.0–0.2)

## 2022-09-10 LAB — COMPREHENSIVE METABOLIC PANEL
ALT: 45 U/L — ABNORMAL HIGH (ref 0–44)
AST: 23 U/L (ref 15–41)
Albumin: 4.9 g/dL (ref 3.5–5.0)
Alkaline Phosphatase: 42 U/L (ref 38–126)
Anion gap: 9 (ref 5–15)
BUN: 10 mg/dL (ref 6–20)
CO2: 26 mmol/L (ref 22–32)
Calcium: 10.4 mg/dL — ABNORMAL HIGH (ref 8.9–10.3)
Chloride: 103 mmol/L (ref 98–111)
Creatinine, Ser: 0.68 mg/dL (ref 0.44–1.00)
GFR, Estimated: >60 mL/min (ref >60)
Glucose, Bld: 86 mg/dL (ref 70–99)
Potassium: 3.5 mmol/L (ref 3.5–5.1)
Sodium: 138 mmol/L (ref 135–145)
Total Bilirubin: 0.4 mg/dL (ref 0.3–1.2)
Total Protein: 8 g/dL (ref 6.5–8.1)

## 2022-09-10 LAB — LIPASE, BLOOD: Lipase: 34 U/L (ref 11–51)

## 2022-09-10 LAB — URINALYSIS, ROUTINE W REFLEX MICROSCOPIC
Bilirubin Urine: NEGATIVE
Glucose, UA: NEGATIVE mg/dL
Hgb urine dipstick: NEGATIVE
Ketones, ur: NEGATIVE mg/dL
Leukocytes,Ua: NEGATIVE
Nitrite: NEGATIVE
Protein, ur: NEGATIVE mg/dL
Specific Gravity, Urine: 1.01 (ref 1.005–1.030)
pH: 7.5 (ref 5.0–8.0)

## 2022-09-10 LAB — HCG, QUANTITATIVE, PREGNANCY: hCG, Beta Chain, Quant, S: <1 m[IU]/mL (ref <5)

## 2022-09-10 MED ORDER — OXYCODONE-ACETAMINOPHEN 5-325 MG PO TABS: 1.0000 | ORAL_TABLET | Freq: Four times a day (QID) | ORAL | 0 refills | Status: DC | PRN

## 2022-09-10 MED ORDER — IOHEXOL 300 MG/ML  SOLN
100.0000 mL | Freq: Once | INTRAMUSCULAR | Status: AC | PRN
  Administered 2022-09-10: 100 mL via INTRAVENOUS

## 2022-09-10 MED ORDER — ONDANSETRON HCL 4 MG/2ML IJ SOLN
4.0000 mg | Freq: Once | INTRAMUSCULAR | Status: AC
  Administered 2022-09-10: 4 mg via INTRAVENOUS
  Filled 2022-09-10: qty 2

## 2022-09-10 MED ORDER — ONDANSETRON HCL 4 MG PO TABS: 4.0000 mg | ORAL_TABLET | Freq: Four times a day (QID) | ORAL | 0 refills | Status: DC

## 2022-09-10 MED ORDER — MORPHINE SULFATE (PF) 4 MG/ML IV SOLN
4.0000 mg | Freq: Once | INTRAVENOUS | Status: AC
  Administered 2022-09-10: 4 mg via INTRAVENOUS
  Filled 2022-09-10: qty 1

## 2022-09-10 NOTE — ED Provider Notes (Signed)
[facility] Provider Note   CSN: [id number] Arrival date & time: 09/10/22  1148     History  Chief Complaint  Patient presents with   Abdominal Pain   Nausea    Kiara Harris is a 22 y.o. female with a past medical history of pancreatic cancer status post distal pancreatectomy and splenectomy in 07/2022 presenting today for evaluation of abdominal pain.  Patient states she has had right upper abdominal pain for the last 3 days.  Pain is located in the right upper quadrant, intermittent, worse with eating, better with laying on 1 side.  Patient reports she has tried oxycodone for pain with some improvement.  She endorses nausea without vomiting.  She reports seeing blood in her stool 1 time yesterday.  She denies fever, chest pain, shortness of breath, bowel changes, urinary symptoms.  Patient was evaluated on 1/9//2024, found to have an ovarian cyst based on the CT scan in January however she has not had a chance to follow-up with OB/GYN.  Patient reported at that time the pain is located in the right lower quadrant which is different from this time.   Abdominal Pain   Past Medical History:  Diagnosis Date   Acid reflux    Anxiety and depression    Asthma    Cancer (HCC)    Pancreatic mucinous cystic neoplasm   Family history of adverse reaction to anesthesia    Mother trouble waking   Migraine    PCOS (polycystic ovarian syndrome)    Past Surgical History:  Procedure Laterality Date   BIOPSY  05/17/2022   Procedure: BIOPSY;  Surgeon: Irving Copas., MD;  Location: [facility];  Service: Gastroenterology;;   ESOPHAGOGASTRODUODENOSCOPY (EGD) WITH PROPOFOL N/A 05/17/2022   Procedure: ESOPHAGOGASTRODUODENOSCOPY (EGD) WITH PROPOFOL;  Surgeon: Irving Copas., MD;  Location: [facility];  Service: Gastroenterology;  Laterality: N/A;   EUS N/A 05/17/2022   Procedure: UPPER ENDOSCOPIC ULTRASOUND (EUS) LINEAR;   Surgeon: Irving Copas., MD;  Location: [facility];  Service: Gastroenterology;  Laterality: N/A;   FINE NEEDLE ASPIRATION  05/17/2022   Procedure: FINE NEEDLE ASPIRATION (FNA) LINEAR;  Surgeon: Irving Copas., MD;  Location: [facility];  Service: Gastroenterology;;   PANCREATECTOMY N/A 07/22/2022   Procedure: LAPAROSCOPIC DISTAL PANCREATECTOMY;  Surgeon: Dwan Bolt, MD;  Location: [place];  Service: General;  Laterality: N/A;   SPLENECTOMY, TOTAL N/A 07/22/2022   Procedure: SPLENECTOMY;  Surgeon: Dwan Bolt, MD;  Location: [place];  Service: General;  Laterality: N/A;   WISDOM TOOTH EXTRACTION Bilateral      Home Medications Prior to Admission medications   Medication Sig Start Date End Date Taking? Authorizing Provider  ondansetron (ZOFRAN) 4 MG tablet Take 1 tablet (4 mg total) by mouth every 6 (six) hours. 09/10/22  Yes Rex Kras, PA  oxyCODONE-acetaminophen (PERCOCET/ROXICET) 5-325 MG tablet Take 1 tablet by mouth every 6 (six) hours as needed for up to 10 doses for severe pain. 09/10/22  Yes Rex Kras, PA  acetaminophen (TYLENOL) 325 MG tablet Take 650 mg by mouth every 6 (six) hours as needed for moderate pain.    [provider]  albuterol (VENTOLIN HFA) 108 (90 Base) MCG/ACT inhaler Inhale 1-2 puffs into the lungs every 6 (six) hours as needed for wheezing or shortness of breath.    [provider]  buPROPion ER (WELLBUTRIN SR) 100 MG 12 hr tablet Take 100 mg by mouth daily as needed (depression). 04/03/22  [provider]  dicyclomine (BENTYL) 20 MG tablet Take 20 mg by mouth 2 (two) times daily as needed for spasms or pain. 04/23/22   [provider]  docusate sodium (COLACE) 100 MG capsule Take 1 capsule (100 mg total) by mouth 2 (two) times daily. 07/27/22   Dwan Bolt, MD  HYDROcodone-acetaminophen (NORCO/VICODIN) 5-325 MG tablet Take 1-2 tablets by mouth every 6 (six) hours as needed for severe pain. 08/16/22   Clark,  Meghan R, PA  Lactic Ac-Citric Ac-Pot Bitart (PHEXXI) 1.8-1-0.4 % GEL Place 5 g vaginally as directed. Up to 1 hour before intercourse 07/21/22   Chrzanowski, Jami B, NP  meloxicam (MOBIC) 7.5 MG tablet Take 7.5 mg by mouth 2 (two) times daily as needed for pain. 03/23/22   [provider]  methocarbamol (ROBAXIN) 750 MG tablet Take 1 tablet (750 mg total) by mouth every 6 (six) hours as needed for muscle spasms. 07/27/22   Dwan Bolt, MD  ondansetron (ZOFRAN-ODT) 4 MG disintegrating tablet Take 4 mg by mouth every 8 (eight) hours as needed for vomiting or nausea. 05/16/20   [provider]  ondansetron (ZOFRAN-ODT) 4 MG disintegrating tablet Take 1 tablet (4 mg total) by mouth every 6 (six) hours as needed for nausea or vomiting. 07/27/22   Dwan Bolt, MD  pantoprazole (PROTONIX) 40 MG tablet Take 40 mg by mouth 2 (two) times daily as needed (acid reflux/indigestion). 09/04/20   [provider]      Allergies    Doxycycline hyclate, Doxycycline, Latex, Amoxil [amoxicillin], and Multihance [gadobenate]    Review of Systems   Review of Systems  Gastrointestinal:  Positive for abdominal pain.    Physical Exam Updated Vital Signs BP 124/73 (BP Location: Left Arm)   Pulse 85   Temp 98.5 F (36.9 C) (Oral)   Resp 18   Ht '5\' 5"'$  (1.651 m)   Wt 113.4 kg   SpO2 100%   BMI 41.60 kg/m  Physical Exam Vitals and nursing note reviewed.  Constitutional:      Appearance: Normal appearance.  HENT:     Head: Normocephalic and atraumatic.     Mouth/Throat:     Mouth: Mucous membranes are moist.  Eyes:     General: No scleral icterus. Cardiovascular:     Rate and Rhythm: Normal rate and regular rhythm.     Pulses: Normal pulses.     Heart sounds: Normal heart sounds.  Pulmonary:     Effort: Pulmonary effort is normal.     Breath sounds: Normal breath sounds.  Abdominal:     General: Abdomen is flat.     Palpations: Abdomen is soft.     Tenderness: There  is abdominal tenderness in the right upper quadrant.  Musculoskeletal:        General: No deformity.  Skin:    General: Skin is warm.     Findings: No rash.  Neurological:     General: No focal deficit present.     Mental Status: She is alert.  Psychiatric:        Mood and Affect: Mood normal.     ED Results / Procedures / Treatments   Labs (all labs ordered are listed, but only abnormal results are displayed) Labs Reviewed  CBC WITH DIFFERENTIAL/PLATELET - Abnormal; Notable for the following components:      Result Value   Platelets 634 (*)    All other components within normal limits  COMPREHENSIVE METABOLIC PANEL - Abnormal; Notable  for the following components:   Calcium 10.4 (*)    ALT 45 (*)    All other components within normal limits  URINALYSIS, ROUTINE W REFLEX MICROSCOPIC - Abnormal; Notable for the following components:   APPearance HAZY (*)    Bacteria, UA MANY (*)    All other components within normal limits  LIPASE, BLOOD  HCG, QUANTITATIVE, PREGNANCY    EKG None  Radiology CT ABDOMEN PELVIS W CONTRAST  Result Date: 09/10/2022 CLINICAL DATA:  Abdominal pain. Postop RIGHT upper quadrant pain. Abdominal surgery in December partial pancreatic resection. EXAM: CT ABDOMEN AND PELVIS WITH CONTRAST TECHNIQUE: Multidetector CT imaging of the abdomen and pelvis was performed using the standard protocol following bolus administration of intravenous contrast. RADIATION DOSE REDUCTION: This exam was performed according to the departmental dose-optimization program which includes automated exposure control, adjustment of the mA and/or kV according to patient size and/or use of iterative reconstruction technique. CONTRAST:  166m OMNIPAQUE IOHEXOL 300 MG/ML  SOLN COMPARISON:  CT 08/16/2022, MRI 1 13 2023 FINDINGS: Lower chest: Lung bases are clear. Hepatobiliary: No focal hepatic lesion. Normal gallbladder. No biliary duct dilatation. Common bile duct is normal. Pancreas:  Resection of the pancreatic tail. No new growth along the surgical staple line at the pancreatic tail. The body and head of the pancreas are normal. No pancreatic duct dilatation. There lobules of postsurgical fatty change in the peritoneal space along the adjacent to the greater curvature of stomach related to the partial pancreatectomy and splenectomy. No evidence of inflammation or infection Spleen: Post splenectomy. Adrenals/urinary tract: Adrenal glands and kidneys are normal. The ureters and bladder normal. Stomach/Bowel: Stomach is normal. Resection staples along the greater curvature of the stomach without complicating features. Duodenum small bowel normal. Appendix normal. The colon and rectosigmoid colon are normal. Vascular/Lymphatic: Abdominal aorta is normal caliber. No periportal or retroperitoneal adenopathy. No pelvic adenopathy. Reproductive: Uterus and adnexa unremarkable. Other: Midline ventral surgical scar without complication Musculoskeletal: No aggressive osseous lesion. IMPRESSION: 1. Status post distal pancreatectomy and splenectomy. No complicating features. 2. No acute findings in the abdomen pelvis. 3. Midline ventral surgical scar without complication. 4. Normal appendix and gallbladder. 5. No obstructive uropathy. Electronically Signed   By: SSuzy BouchardM.D.   On: 09/10/2022 15:18    Procedures Procedures    Medications Ordered in ED Medications  ondansetron (ZOFRAN) injection 4 mg (4 mg Intravenous Given 09/10/22 1312)  morphine (PF) 4 MG/ML injection 4 mg (4 mg Intravenous Given 09/10/22 1312)  iohexol (OMNIPAQUE) 300 MG/ML solution 100 mL (100 mLs Intravenous Contrast Given 09/10/22 1449)    ED Course/ Medical Decision Making/ A&P                             Medical Decision Making Amount and/or Complexity of Data Reviewed Labs: ordered. Radiology: ordered.  Risk Prescription drug management.   This patient presents to the ED for abdominal pain, this involves  an extensive number of treatment options, and is a complaint that carries with a high risk of complications and morbidity.  The differential diagnosis includes aortic dissection, ACS/MI, enteritis, cholecystitis, GERD, pancreatitis, pneumonia, PUD.  This is not an exhaustive list.  Lab tests: I ordered and personally interpreted labs.  The pertinent results include: WBC unremarkable. Hbg unremarkable. Platelets unremarkable. No electrolyte abnormalities noted. BUN, creatinine unremarkable. UA significant for no acute abnormality.  Hcg < 1.  Imaging studies: I ordered imaging studies. I personally reviewed,  interpreted imaging and agree with the radiologist's interpretations. The results include: CT showed no acute abnormalities.   Problem list/ ED course/ Critical interventions/ Medical management: HPI: See above Vital signs within normal range and stable throughout visit. Laboratory/imaging studies significant for: See above. On physical examination, patient is afebrile and appears in no acute distress. Abdominal exam without peritoneal signs. No evidence of acute abdomen at this time. Well appearing. Given work up, low suspicion for acute hepatobiliary disease (including acute cholecystitis or cholangitis), acute pancreatitis (neg lipase), PUD (including gastric perforation), acute infectious processes (pneumonia, hepatitis, pyelonephritis), acute appendicitis, vascular catastrophe, bowel obstruction, viscus perforation, or testicular torsion, diverticulitis. Presentation not consistent with other acute, emergent causes of abdominal pain at this time. Based on patient's clinical presentations and laboratory/imaging studies I suspect musculoskeletal pain.  Morphine ordered for pain and Zofran ordered for nausea.  Reevaluation of the patient after these medications showed that the patient improved.  I sent an Rx of Percocet for pain control as needed. Advised patient to follow-up with PCP and  gastroenterology for further evaluation management, return to the ER for new or worsening symptoms. I have reviewed the patient home medicines and have made adjustments as needed.  Cardiac monitoring/EKG: The patient was maintained on a cardiac monitor.  I personally reviewed and interpreted the cardiac monitor which showed an underlying rhythm of: sinus rhythm.  Additional history obtained: External records from outside source obtained and reviewed including: Chart review including previous notes, labs, imaging.  Disposition Continued outpatient therapy. Follow-up with PCP and gastroenterology recommended for reevaluation of symptoms. Treatment plan discussed with patient.  Pt acknowledged understanding was agreeable to the plan. Worrisome signs and symptoms were discussed with patient, and patient acknowledged understanding to return to the ED if they noticed these signs and symptoms. Patient was stable upon discharge.   This chart was dictated using voice recognition software.  Despite best efforts to proofread,  errors can occur which can change the documentation meaning.          Final Clinical Impression(s) / ED Diagnoses Final diagnoses:  Right upper quadrant abdominal pain    Rx / DC Orders ED Discharge Orders          Ordered    ondansetron (ZOFRAN) 4 MG tablet  Every 6 hours        09/10/22 1638    oxyCODONE-acetaminophen (PERCOCET/ROXICET) 5-325 MG tablet  Every 6 hours PRN        09/10/22 1638              Rex Kras, [place] 09/10/22 1814    Pattricia Boss, MD 09/11/22 [phone]

## 2022-09-10 NOTE — ED Triage Notes (Signed)
Pt c/o intermittent RUQ abdominal pain and nausea x4 days.  Sts pain increased w/ eating.  Pt had abdominal surgery in December.

## 2022-09-10 NOTE — Discharge Instructions (Addendum)
Your workups were reassuring today. Please take your medications as prescribed. Take tylenol/ibuprofen or Percocet as needed for pain. I recommend close follow-up with GI and primary care for reevaluation.  Please do not hesitate to return to emergency department if worrisome signs symptoms we discussed become apparent.

## 2022-09-20 ENCOUNTER — Ambulatory Visit
Admission: RE | Admit: 2022-09-20 | Discharge: 2022-09-20 | Disposition: A | Discharge status: Home / Self Care | Payer: 59 | Source: Ambulatory Visit | Attending: Surgery | Admitting: Surgery

## 2022-09-20 DIAGNOSIS — R1011 Right upper quadrant pain: Secondary | ICD-10-CM

## 2022-10-08 ENCOUNTER — Encounter: Payer: Self-pay | Admitting: Gastroenterology

## 2022-10-08 ENCOUNTER — Other Ambulatory Visit: Payer: 59

## 2022-10-08 ENCOUNTER — Ambulatory Visit (INDEPENDENT_AMBULATORY_CARE_PROVIDER_SITE_OTHER): Payer: 59 | Admitting: Gastroenterology

## 2022-10-08 VITALS — BP 114/70 | HR 80 | Ht 65.5 in | Wt 255.5 lb

## 2022-10-08 DIAGNOSIS — R1011 Right upper quadrant pain: Secondary | ICD-10-CM | POA: Diagnosis not present

## 2022-10-08 NOTE — Progress Notes (Signed)
10/08/2022 Kiara Harris [id number] Jul 06, 2001   HISTORY OF PRESENT ILLNESS: This is a 22 year old female who is patient of Dr. Donneta Romberg, just known to him for EUS back in October.  Was found to have a pancreatic mucinous cystic neoplasm.  Had a laparoscopic distal pancreatectomy and splenectomy with Dr. Zenia Resides in December 2023.  She is here today for evaluation of right upper quadrant abdominal pain.  She said that she has had this pain intermittently even prior to all of the pancreatic stuff, dating back a couple of years ago.  Recently she been having more frequent episodes.  Pain definitely comes and goes, more so after eating.  Gets very severe up to about an 8 or 9 out of 10 on the pain scale.  Says she feels like somebody punched her really hard in the side and caused her to be unable to function when it is present.  Recent CT scan and ultrasound unrevealing as to cause her pain.  Dr. Zenia Resides does not think that anything related to her surgery.   Past Medical History:  Diagnosis Date   Acid reflux    Anxiety and depression    Asthma    Cancer (HCC)    Pancreatic mucinous cystic neoplasm   Family history of adverse reaction to anesthesia    Mother trouble waking   Migraine    PCOS (polycystic ovarian syndrome)    Past Surgical History:  Procedure Laterality Date   BIOPSY  05/17/2022   Procedure: BIOPSY;  Surgeon: Irving Copas., MD;  Location: [facility];  Service: Gastroenterology;;   ESOPHAGOGASTRODUODENOSCOPY (EGD) WITH PROPOFOL N/A 05/17/2022   Procedure: ESOPHAGOGASTRODUODENOSCOPY (EGD) WITH PROPOFOL;  Surgeon: Irving Copas., MD;  Location: [facility];  Service: Gastroenterology;  Laterality: N/A;   EUS N/A 05/17/2022   Procedure: UPPER ENDOSCOPIC ULTRASOUND (EUS) LINEAR;  Surgeon: Irving Copas., MD;  Location: [facility];  Service: Gastroenterology;  Laterality: N/A;   FINE NEEDLE ASPIRATION  05/17/2022   Procedure: FINE NEEDLE  ASPIRATION (FNA) LINEAR;  Surgeon: Irving Copas., MD;  Location: [facility];  Service: Gastroenterology;;   PANCREATECTOMY N/A 07/22/2022   Procedure: LAPAROSCOPIC DISTAL PANCREATECTOMY;  Surgeon: Dwan Bolt, MD;  Location: [place];  Service: General;  Laterality: N/A;   SPLENECTOMY, TOTAL N/A 07/22/2022   Procedure: SPLENECTOMY;  Surgeon: Dwan Bolt, MD;  Location: [place];  Service: General;  Laterality: N/A;   WISDOM TOOTH EXTRACTION Bilateral     reports that she has quit smoking. Her smoking use included e-cigarettes. She has never used smokeless tobacco. She reports current alcohol use. She reports that she does not use drugs. family history includes Cancer - Other in her maternal grandmother; Cirrhosis in her mother; Heart attack in her maternal grandfather. Allergies  Allergen Reactions   Doxycycline Hyclate Nausea And Vomiting   Doxycycline Nausea And Vomiting   Latex Itching    With prolonged exposure=itching.   Sulfa Antibiotics     Other Reaction(s): Unknown   Amoxil [Amoxicillin] Rash   Multihance [Gadobenate] Itching    Hands and throat itching after MRI scan on 08/07/21. Pt would need 13 hr prep prior to MRI contrast. - Dr. Logan Bores.       Outpatient Encounter Medications as of 10/08/2022  Medication Sig   acetaminophen (TYLENOL) 325 MG tablet Take 650 mg by mouth every 6 (six) hours as needed for moderate pain.   albuterol (VENTOLIN HFA) 108 (90 Base) MCG/ACT inhaler Inhale 1-2 puffs into the  lungs every 6 (six) hours as needed for wheezing or shortness of breath.   ALPRAZolam (XANAX) 0.25 MG tablet Take 0.25 mg by mouth as needed.   buPROPion ER (WELLBUTRIN SR) 100 MG 12 hr tablet Take 100 mg by mouth daily as needed (depression).   pantoprazole (PROTONIX) 40 MG tablet Take 40 mg by mouth 2 (two) times daily as needed (acid reflux/indigestion).   Butalbital-APAP-Caffeine 50-300-40 MG CAPS Take 1 capsule by mouth as needed. (Patient not taking:  Reported on 10/08/2022)   dicyclomine (BENTYL) 20 MG tablet Take 20 mg by mouth 2 (two) times daily as needed for spasms or pain. (Patient not taking: Reported on 10/08/2022)   HYDROcodone-acetaminophen (NORCO/VICODIN) 5-325 MG tablet Take 1-2 tablets by mouth every 6 (six) hours as needed for severe pain. (Patient not taking: Reported on 10/08/2022)   meloxicam (MOBIC) 7.5 MG tablet Take 7.5 mg by mouth 2 (two) times daily as needed for pain. (Patient not taking: Reported on 10/08/2022)   ondansetron (ZOFRAN) 4 MG tablet Take 1 tablet (4 mg total) by mouth every 6 (six) hours. (Patient not taking: Reported on 10/08/2022)   oxyCODONE-acetaminophen (PERCOCET/ROXICET) 5-325 MG tablet Take 1 tablet by mouth every 6 (six) hours as needed for up to 10 doses for severe pain. (Patient not taking: Reported on 10/08/2022)   [DISCONTINUED] docusate sodium (COLACE) 100 MG capsule Take 1 capsule (100 mg total) by mouth 2 (two) times daily.   [DISCONTINUED] Lactic Ac-Citric Ac-Pot Bitart (PHEXXI) 1.8-1-0.4 % GEL Place 5 g vaginally as directed. Up to 1 hour before intercourse   [DISCONTINUED] methocarbamol (ROBAXIN) 750 MG tablet Take 1 tablet (750 mg total) by mouth every 6 (six) hours as needed for muscle spasms.   [DISCONTINUED] ondansetron (ZOFRAN-ODT) 4 MG disintegrating tablet Take 4 mg by mouth every 8 (eight) hours as needed for vomiting or nausea.   [DISCONTINUED] ondansetron (ZOFRAN-ODT) 4 MG disintegrating tablet Take 1 tablet (4 mg total) by mouth every 6 (six) hours as needed for nausea or vomiting.   No facility-administered encounter medications on file as of 10/08/2022.     REVIEW OF SYSTEMS  : All other systems reviewed and negative except where noted in the History of Present Illness.   PHYSICAL EXAM: BP 114/70 (BP Location: Left Arm, Patient Position: Sitting, Cuff Size: Large)   Pulse 80   Ht 5' 5.5" (1.664 m)   Wt 255 lb 8 oz (115.9 kg)   LMP 09/20/2022   BMI 41.87 kg/m  General: Well developed  white female in no acute distress Head: Normocephalic and atraumatic Eyes:  Sclerae anicteric, conjunctiva pink. Ears: Normal auditory acuity Lungs: Clear throughout to auscultation; no W/R/R. Heart: Regular rate and rhythm; no M/R/G. Abdomen: Soft, non-distended.  BS present.  Some RUQ TTP. Musculoskeletal: Symmetrical with no gross deformities  Skin: No lesions on visible extremities Extremities: No edema  Psychological:  Alert and cooperative. Normal mood and affect  ASSESSMENT AND PLAN: *RUQ abdominal pain: Intermittent severe right upper quadrant abdominal pain, tends to occur after eating.  Suspect gallbladder source.  CT scan and ultrasound okay.  Will get a HIDA scan with CCK.  CC:  Nickola Major, MD

## 2022-10-08 NOTE — Patient Instructions (Signed)
You have been scheduled for a HIDA scan at [facility] (1st floor) on Tuesday 10/13/22. Please arrive 30 minutes prior to your scheduled appointment at  8 am. Make certain not to have anything to eat or drink at least 6 hours prior to your test. Should this appointment date or time not work well for you, please call radiology scheduling at [phone].  _____________________________________________________________________ hepatobiliary (HIDA) scan is an imaging procedure used to diagnose problems in the liver, gallbladder and bile ducts. In the HIDA scan, a radioactive chemical or tracer is injected into a vein in your arm. The tracer is handled by the liver like bile. Bile is a fluid produced and excreted by your liver that helps your digestive system break down fats in the foods you eat. Bile is stored in your gallbladder and the gallbladder releases the bile when you eat a meal. A special nuclear medicine scanner (gamma camera) tracks the flow of the tracer from your liver into your gallbladder and small intestine.  During your HIDA scan  You'll be asked to change into a hospital gown before your HIDA scan begins. Your health care team will position you on a table, usually on your back. The radioactive tracer is then injected into a vein in your arm.The tracer travels through your bloodstream to your liver, where it's taken up by the bile-producing cells. The radioactive tracer travels with the bile from your liver into your gallbladder and through your bile ducts to your small intestine.You may feel some pressure while the radioactive tracer is injected into your vein. As you lie on the table, a special gamma camera is positioned over your abdomen taking pictures of the tracer as it moves through your body. The gamma camera takes pictures continually for about an hour. You'll need to keep still during the HIDA scan. This can become uncomfortable, but you may find that you can lessen the discomfort  by taking deep breaths and thinking about other things. Tell your health care team if you're uncomfortable. The radiologist will watch on a computer the progress of the radioactive tracer through your body. The HIDA scan may be stopped when the radioactive tracer is seen in the gallbladder and enters your small intestine. This typically takes about an hour. In some cases extra imaging will be performed if original images aren't satisfactory, if morphine is given to help visualize the gallbladder or if the medication CCK is given to look at the contraction of the gallbladder. This test typically takes 2 hours to complete. ________________________________________________________________________  If your blood pressure at your visit was 140/90 or greater, please contact your primary care physician to follow up on this.  _______________________________________________________  If you are age 46 or older, your body mass index should be between 23-30. Your Body mass index is 41.87 kg/m. If this is out of the aforementioned range listed, please consider follow up with your Primary Care Provider.  If you are age 30 or younger, your body mass index should be between 19-25. Your Body mass index is 41.87 kg/m. If this is out of the aformentioned range listed, please consider follow up with your Primary Care Provider.   ________________________________________________________  The [facility] providers would like to encourage you to use [facility] to communicate with providers for non-urgent requests or questions.  Due to long hold times on the telephone, sending your provider a message by [facility] may be a faster and more efficient way to get a response.  Please allow 48 business  hours for a response.  Please remember that this is for non-urgent requests.  _______________________________________________________

## 2022-10-10 NOTE — Progress Notes (Signed)
Attending Physician's Attestation   I have reviewed the chart.   I agree with the Advanced Practitioner's note, impression, and recommendations with any updates as below.    Naudia Crosley Mansouraty, MD [facility] Advanced Endoscopy Office # [phone]  

## 2022-10-13 ENCOUNTER — Encounter (HOSPITAL_COMMUNITY)
Admission: RE | Admit: 2022-10-13 | Discharge: 2022-10-13 | Disposition: A | Discharge status: Home / Self Care | Payer: 59 | Source: Ambulatory Visit | Attending: Gastroenterology | Admitting: Gastroenterology

## 2022-10-13 DIAGNOSIS — R1011 Right upper quadrant pain: Secondary | ICD-10-CM | POA: Diagnosis present

## 2022-10-20 ENCOUNTER — Ambulatory Visit: Payer: Self-pay | Admitting: Surgery

## 2022-10-20 NOTE — H&P (View-Only) (Signed)
History of Present Illness: Kiara Harris is a 22 y.o. female who is seen today for evaluation of abdominal pain. 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 Postoperatively she reported symptoms of epigastric pain and reflux at her last visit with me and an antacid was recommended. She continued to have postprandial pain and a RUQ US was ordered and completed on 2/12, which was normal. She saw GI for evaluation and had a HIDA on 3/6, which showed a normal EF of 35% with patency of the cystic duct and biliary tree. She was referred back to me to discuss potential cholecystectomy. She says she has been regularly taking a PPI and it does help with her reflux, but she now has consistent RUQ pain that occurs every day. It starts about an hour after eating, especially greasy foods. It is associated with nausea but no vomiting.    On her EGD/EUS in October for workup of her pancreatic cyst, mild gastric erythema was noted, and biopsies showed reactive gastropathy and no H pylori. No ulcer disease was noted. A small amount of sludge was visible in the gallbladder on EUS.   Review of Systems: A complete review of systems was obtained from the patient.  I have reviewed this information and discussed as appropriate with the patient.  See HPI as well for other ROS.       Medical History: Past Medical History Past Medical History: Diagnosis Date  Anxiety    Arthritis    Asthma, unspecified asthma severity, unspecified whether complicated, unspecified whether persistent    GERD (gastroesophageal reflux disease)        Patient Active Problem List Diagnosis  Pancreatic cyst     Past Surgical History Past Surgical History: Procedure Laterality Date  PANCREATECTOMY   2023  SPLENECTOMY   2023      Allergies Allergies Allergen Reactions  Amoxicillin Rash  Doxycycline Nausea And Vomiting  Latex Itching and Other (See Comments)     With prolonged  exposure=itching.  Other Other (See Comments)  Sulfa (Sulfonamide Antibiotics) Unknown  Sulfamethoxazole-Trimethoprim Other (See Comments)  Gadobenate Dimeglumine Itching     Hands and throat itching after MRI scan on 08/07/21. Pt would need 13 hr prep prior to MRI contrast. - Dr. Brighton Pilley grady.    Hands and throat itching after MRI scan on 08/07/21. Pt would need 13 hr prep prior to MRI contrast. - Dr. Randee Huston grady.      Current Outpatient Medications on File Prior to Visit Medication Sig Dispense Refill  acetaminophen (TYLENOL) 325 MG tablet Take by mouth      albuterol 90 mcg/actuation inhaler Inhale into the lungs      azithromycin (ZITHROMAX) 250 MG tablet as directed      buPROPion (WELLBUTRIN SR) 100 MG SR tablet Take by mouth      docusate (COLACE) 100 MG capsule Take 100 mg by mouth 2 (two) times daily      doxycycline (VIBRAMYCIN) 100 MG capsule Take 100 mg by mouth 2 (two) times daily      methocarbamoL (ROBAXIN) 750 MG tablet Take by mouth      ondansetron (ZOFRAN-ODT) 4 MG disintegrating tablet Take 4 mg by mouth every 6 (six) hours as needed      oxyCODONE (ROXICODONE) 5 MG immediate release tablet Take by mouth      pantoprazole (PROTONIX) 40 MG DR tablet Take by mouth       No current facility-administered   medications on file prior to visit.     Family History Family History Problem Relation Age of Onset  Obesity Mother    Obesity Father    Stroke Maternal Grandfather    Hyperlipidemia (Elevated cholesterol) Maternal Grandfather    High blood pressure (Hypertension) Maternal Grandfather    Coronary Artery Disease (Blocked arteries around heart) Maternal Grandfather        Social History   Tobacco Use Smoking Status Never Smokeless Tobacco Not on file     Social History Social History    Socioeconomic History  Marital status: Single Tobacco Use  Smoking status: Never Vaping Use  Vaping Use: Every day Substance and Sexual Activity  Alcohol  use: Yes     Comment: occasionally  Drug use: Never      Objective:     Vitals:   10/20/22 1448 BP: 134/83 Pulse: 82 Temp: 37 C (98.6 F) SpO2: 99% Weight: (!) 115.8 kg (255 lb 6.4 oz) Height: 167.6 cm (5' 6") PainSc:   4   Body mass index is 41.22 kg/m.   Physical Exam Vitals reviewed.  Constitutional:      General: She is not in acute distress.    Appearance: Normal appearance.  HENT:     Head: Normocephalic and atraumatic.  Pulmonary:     Effort: Pulmonary effort is normal. No respiratory distress.  Abdominal:     General: There is no distension.     Palpations: Abdomen is soft.     Tenderness: There is no abdominal tenderness.     Comments: Well-healed port-site surgical scars.  Skin:    General: Skin is warm and dry.     Coloration: Skin is not jaundiced.  Neurological:     General: No focal deficit present.     Mental Status: She is alert and oriented to person, place, and time.          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 She now has persistent post-prandial RUQ abdominal pain. Her gallbladder imaging has all technically been normal thus far, aside from sludge noted on EUS, which is an indication of gallbladder dysfunction. Her symptoms are very consistent with biliary colic and imaging and endoscopy have not indicated another etiology for her pain. I recommended proceeding with cholecystectomy. The details of laparoscopic cholecystectomy were discussed with the patient, including the risks of bleeding, infection, bile leak, and <0.5% risk of common bile duct injury. The patient expressed understanding and agrees to proceed with surgery. 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  She will be contacted to schedule a surgery date, and will call if symptoms worsen and she would like to move her surgery date up.  Ostin Mathey, MD [facility], Hepatobiliary and Pancreatic Surgery 10/20/22 3:54 PM   

## 2022-10-20 NOTE — H&P (Signed)
History of Present Illness: Kiara Harris is a 22 y.o. female who is seen today for evaluation of abdominal pain. 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 Postoperatively she reported symptoms of epigastric pain and reflux at her last visit with me and an antacid was recommended. She continued to have postprandial pain and a RUQ [place] was ordered and completed on 2/12, which was normal. She saw GI for evaluation and had a HIDA on 3/6, which showed a normal EF of 35% with patency of the cystic duct and biliary tree. She was referred back to me to discuss potential cholecystectomy. She says she has been regularly taking a PPI and it does help with her reflux, but she now has consistent RUQ pain that occurs every day. It starts about an hour after eating, especially greasy foods. It is associated with nausea but no vomiting.    On her EGD/EUS in October for workup of her pancreatic cyst, mild gastric erythema was noted, and biopsies showed reactive gastropathy and no H pylori. No ulcer disease was noted. A small amount of sludge was visible in the gallbladder on EUS.   Review of Systems: A complete review of systems was obtained from the patient.  I have reviewed this information and discussed as appropriate with the patient.  See HPI as well for other ROS.       Medical History: Past Medical History Past Medical History: Diagnosis Date  Anxiety    Arthritis    Asthma, unspecified asthma severity, unspecified whether complicated, unspecified whether persistent    GERD (gastroesophageal reflux disease)        Patient Active Problem List Diagnosis  Pancreatic cyst     Past Surgical History Past Surgical History: Procedure Laterality Date  PANCREATECTOMY   2023  SPLENECTOMY   2023      Allergies Allergies Allergen Reactions  Amoxicillin Rash  Doxycycline Nausea And Vomiting  Latex Itching and Other (See Comments)     With prolonged  exposure=itching.  Other Other (See Comments)  Sulfa (Sulfonamide Antibiotics) Unknown  Sulfamethoxazole-Trimethoprim Other (See Comments)  Gadobenate Dimeglumine Itching     Hands and throat itching after MRI scan on 08/07/21. Pt would need 13 hr prep prior to MRI contrast. - Dr. Logan Bores.    Hands and throat itching after MRI scan on 08/07/21. Pt would need 13 hr prep prior to MRI contrast. - Dr. Logan Bores.      Current Outpatient Medications on File Prior to Visit Medication Sig Dispense Refill  acetaminophen (TYLENOL) 325 MG tablet Take by mouth      albuterol 90 mcg/actuation inhaler Inhale into the lungs      azithromycin (ZITHROMAX) 250 MG tablet as directed      buPROPion (WELLBUTRIN SR) 100 MG SR tablet Take by mouth      docusate (COLACE) 100 MG capsule Take 100 mg by mouth 2 (two) times daily      doxycycline (VIBRAMYCIN) 100 MG capsule Take 100 mg by mouth 2 (two) times daily      methocarbamoL (ROBAXIN) 750 MG tablet Take by mouth      ondansetron (ZOFRAN-ODT) 4 MG disintegrating tablet Take 4 mg by mouth every 6 (six) hours as needed      oxyCODONE (ROXICODONE) 5 MG immediate release tablet Take by mouth      pantoprazole (PROTONIX) 40 MG DR tablet Take by mouth       No current facility-administered  medications on file prior to visit.     Family History Family History Problem Relation Age of Onset  Obesity Mother    Obesity Father    Stroke Maternal Grandfather    Hyperlipidemia (Elevated cholesterol) Maternal Grandfather    High blood pressure (Hypertension) Maternal Grandfather    Coronary Artery Disease (Blocked arteries around heart) Maternal Grandfather        Social History   Tobacco Use Smoking Status Never Smokeless Tobacco Not on file     Social History Social History    Socioeconomic History  Marital status: Single Tobacco Use  Smoking status: Never Vaping Use  Vaping Use: Every day Substance and Sexual Activity  Alcohol  use: Yes     Comment: occasionally  Drug use: Never      Objective:     Vitals:   10/20/22 1448 BP: 134/83 Pulse: 82 Temp: 37 C (98.6 F) SpO2: 99% Weight: (!) 115.8 kg (255 lb 6.4 oz) Height: 167.6 cm ('5\' 6"'$ ) PainSc:   4   Body mass index is 41.22 kg/m.   Physical Exam Vitals reviewed.  Constitutional:      General: She is not in acute distress.    Appearance: Normal appearance.  HENT:     Head: Normocephalic and atraumatic.  Pulmonary:     Effort: Pulmonary effort is normal. No respiratory distress.  Abdominal:     General: There is no distension.     Palpations: Abdomen is soft.     Tenderness: There is no abdominal tenderness.     Comments: Well-healed port-site surgical scars.  Skin:    General: Skin is warm and dry.     Coloration: Skin is not jaundiced.  Neurological:     General: No focal deficit present.     Mental Status: She is alert and oriented to person, place, and time.          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 She now has persistent post-prandial RUQ abdominal pain. Her gallbladder imaging has all technically been normal thus far, aside from sludge noted on EUS, which is an indication of gallbladder dysfunction. Her symptoms are very consistent with biliary colic and imaging and endoscopy have not indicated another etiology for her pain. I recommended proceeding with cholecystectomy. The details of laparoscopic cholecystectomy were discussed with the patient, including the risks of bleeding, infection, bile leak, and <0.5% risk of common bile duct injury. The patient expressed understanding and agrees to proceed with surgery. 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  She will be contacted to schedule a surgery date, and will call if symptoms worsen and she would like to move her surgery date up.  Michaelle Birks, [facility], Hepatobiliary and Pancreatic Surgery 10/20/22 3:54 PM

## 2022-10-29 NOTE — Pre-Procedure Instructions (Signed)
Surgical Instructions    Your procedure is scheduled on Monday, April 8th.  Report to [facility] Main Entrance "A" at 05:30 A.M., then check in with the Admitting office.  Call this number if you have problems the morning of surgery:  [phone]  If you have any questions prior to your surgery date call [phone]: Open Monday-Friday 8am-4pm If you experience any cold or flu symptoms such as cough, fever, chills, shortness of breath, etc. between now and your scheduled surgery, please notify us at the above number.     Remember:  Do not eat after midnight the night before your surgery  You may drink clear liquids until 04:30 AM the morning of your surgery.   Clear liquids allowed are: Water, Non-Citrus Juices (without pulp), Carbonated Beverages, Clear Tea, Black Coffee Only (NO MILK, CREAM OR POWDERED CREAMER of any kind), and Gatorade.    Take these medicines the morning of surgery with A SIP OF WATER  buPROPion ER (WELLBUTRIN SR)  pantoprazole (PROTONIX)    If needed: acetaminophen (TYLENOL)  albuterol (VENTOLIN HFA)- bring inhaler with you on day of surgery ALPRAZolam Duanne Moron)  UBRELVY    As of today, STOP taking any Aspirin (unless otherwise instructed by your surgeon) Aleve, Naproxen, Ibuprofen, Motrin, Advil, Goody's, BC's, all herbal medications, fish oil, and all vitamins.                     Do NOT Smoke (Tobacco/Vaping) for 24 hours prior to your procedure.  If you use a CPAP at night, you may bring your mask/headgear for your overnight stay.   Contacts, glasses, piercing's, hearing aid's, dentures or partials may not be worn into surgery, please bring cases for these belongings.    For patients admitted to the hospital, discharge time will be determined by your treatment team.   Patients discharged the day of surgery will not be allowed to drive home, and someone needs to stay with them for 24 hours.  SURGICAL WAITING ROOM VISITATION Patients having surgery  or a procedure may have no more than 2 support people in the waiting area - these visitors may rotate.   Children under the age of 70 must have an adult with them who is not the patient. If the patient needs to stay at the hospital during part of their recovery, the visitor guidelines for inpatient rooms apply. Pre-op nurse will coordinate an appropriate time for 1 support person to accompany patient in pre-op.  This support person may not rotate.   Please refer to the [facility] website for the visitor guidelines for Inpatients (after your surgery is over and you are in a regular room).    Special instructions:   [place]- Preparing For Surgery  Before surgery, you can play an important role. Because skin is not sterile, your skin needs to be as free of germs as possible. You can reduce the number of germs on your skin by washing with CHG (chlorahexidine gluconate) Soap before surgery.  CHG is an antiseptic cleaner which kills germs and bonds with the skin to continue killing germs even after washing.    Oral Hygiene is also important to reduce your risk of infection.  Remember - BRUSH YOUR TEETH THE MORNING OF SURGERY WITH YOUR REGULAR TOOTHPASTE  Please do not use if you have an allergy to CHG or antibacterial soaps. If your skin becomes reddened/irritated stop using the CHG.  Do not shave (including legs and underarms) for at least 48 hours  prior to first CHG shower. It is OK to shave your face.  Please follow these instructions carefully.   Shower the NIGHT BEFORE SURGERY and the MORNING OF SURGERY  If you chose to wash your hair, wash your hair first as usual with your normal shampoo.  After you shampoo, rinse your hair and body thoroughly to remove the shampoo.  Use CHG Soap as you would any other liquid soap. You can apply CHG directly to the skin and wash gently with a scrungie or a clean washcloth.   Apply the CHG Soap to your body ONLY FROM THE NECK DOWN.  Do not use on open  wounds or open sores. Avoid contact with your eyes, ears, mouth and genitals (private parts). Wash Face and genitals (private parts)  with your normal soap.   Wash thoroughly, paying special attention to the area where your surgery will be performed.  Thoroughly rinse your body with warm water from the neck down.  DO NOT shower/wash with your normal soap after using and rinsing off the CHG Soap.  Pat yourself dry with a CLEAN TOWEL.  Wear CLEAN PAJAMAS to bed the night before surgery  Place CLEAN SHEETS on your bed the night before your surgery  DO NOT SLEEP WITH PETS.   Day of Surgery: Take a shower with CHG soap. Do not wear jewelry or makeup Do not wear lotions, powders, perfumes, or deodorant. Do not shave 48 hours prior to surgery.   Do not bring valuables to the hospital. [facility] is not responsible for any belongings or valuables. Do not wear nail polish, gel polish, artificial nails, or any other type of covering on natural nails (fingers and toes) If you have artificial nails or gel coating that need to be removed by a nail salon, please have this removed prior to surgery. Artificial nails or gel coating may interfere with anesthesia's ability to adequately monitor your vital signs. Wear Clean/Comfortable clothing the morning of surgery Remember to brush your teeth WITH YOUR REGULAR TOOTHPASTE.   Please read over the following fact sheets that you were given.    If you received a COVID test during your pre-op visit  it is requested that you wear a mask when out in public, stay away from anyone that may not be feeling well and notify your surgeon if you develop symptoms. If you have been in contact with anyone that has tested positive in the last 10 days please notify you surgeon.

## 2022-11-01 ENCOUNTER — Encounter (HOSPITAL_COMMUNITY): Payer: Self-pay

## 2022-11-01 ENCOUNTER — Encounter (HOSPITAL_COMMUNITY)
Admission: RE | Admit: 2022-11-01 | Discharge: 2022-11-01 | Disposition: A | Discharge status: Home / Self Care | Payer: 59 | Source: Ambulatory Visit | Attending: Surgery | Admitting: Surgery

## 2022-11-01 ENCOUNTER — Other Ambulatory Visit: Payer: Self-pay

## 2022-11-01 VITALS — BP 130/79 | HR 68 | Temp 98.2°F | Resp 18 | Ht 66.0 in | Wt 255.4 lb

## 2022-11-01 DIAGNOSIS — Z01812 Encounter for preprocedural laboratory examination: Secondary | ICD-10-CM | POA: Insufficient documentation

## 2022-11-01 DIAGNOSIS — Z01818 Encounter for other preprocedural examination: Secondary | ICD-10-CM

## 2022-11-01 NOTE — Pre-Procedure Instructions (Signed)
Surgical Instructions   Your procedure is scheduled on Monday, November 15, 2022 at 7:30 AM.  Report to [facility] Main Entrance "A" at 05:30 AM,  then check in with the Admitting office.  Call this number if you have problems the morning of surgery:  [phone]  If you have any questions prior to your surgery date call [phone]: Open Monday-Friday 8am-4pm If you experience any cold or flu symptoms such as cough, fever, chills, shortness of breath, etc. between now and your scheduled surgery, please notify us at the above number.    Remember:  Do not eat after midnight the night before your surgery  You may drink clear liquids until 04:30 AM the morning of your surgery.   Clear liquids allowed are: Water, Non-Citrus Juices (without pulp), Carbonated Beverages, Clear Tea, Black Coffee Only (NO MILK, CREAM OR POWDERED CREAMER of any kind), and Gatorade.    Take these medicines the morning of surgery with A SIP OF WATER  buPROPion ER (WELLBUTRIN SR)  pantoprazole (PROTONIX)   If needed: acetaminophen (TYLENOL)  albuterol (VENTOLIN HFA)- bring inhaler with you on day of surgery ALPRAZolam Duanne Moron)  UBRELVY   As of today, STOP taking any Aspirin (unless otherwise instructed by your surgeon) Aleve, Naproxen, Ibuprofen, Motrin, Advil, Goody's, BC's, all herbal medications, fish oil, and all vitamins.  Contacts, glasses, piercing's, hearing aid's, dentures or partials may not be worn into surgery, please bring cases for these belongings.    Patients discharged the day of surgery will not be allowed to drive home, and someone needs to stay with them for 24 hours.  SURGICAL WAITING ROOM VISITATION Patients having surgery or a procedure may have no more than 2 support people in the waiting area - these visitors may rotate.   Children under the age of 65 must have an adult with them who is not the patient. If the patient needs to stay at the hospital during part of their recovery, the visitor  guidelines for inpatient rooms apply. Pre-op nurse will coordinate an appropriate time for 1 support person to accompany patient in pre-op.  This support person may not rotate.   Special instructions:   [place]- Preparing For Surgery  Before surgery, you can play an important role. Because skin is not sterile, your skin needs to be as free of germs as possible. You can reduce the number of germs on your skin by washing with CHG (chlorahexidine gluconate) Soap before surgery.  CHG is an antiseptic cleaner which kills germs and bonds with the skin to continue killing germs even after washing.    Oral Hygiene is also important to reduce your risk of infection.  Remember - BRUSH YOUR TEETH THE MORNING OF SURGERY WITH YOUR REGULAR TOOTHPASTE  Please do not use if you have an allergy to CHG or antibacterial soaps. If your skin becomes reddened/irritated stop using the CHG.  Do not shave (including legs and underarms) for at least 48 hours prior to first CHG shower. It is OK to shave your face.  Please follow these instructions carefully.   Shower the NIGHT BEFORE SURGERY and the MORNING OF SURGERY  If you chose to wash your hair, wash your hair first as usual with your normal shampoo.  After you shampoo, rinse your hair and body thoroughly to remove the shampoo.  Use CHG Soap as you would any other liquid soap. You can apply CHG directly to the skin and wash gently with a scrungie or a clean washcloth.  Apply the CHG Soap to your body ONLY FROM THE NECK DOWN.  Do not use on open wounds or open sores. Avoid contact with your eyes, ears, mouth and genitals (private parts). Wash Face and genitals (private parts)  with your normal soap.   Wash thoroughly, paying special attention to the area where your surgery will be performed.  Thoroughly rinse your body with warm water from the neck down.  DO NOT shower/wash with your normal soap after using and rinsing off the CHG Soap.  Pat yourself  dry with a CLEAN TOWEL.  Wear CLEAN PAJAMAS to bed the night before surgery  Place CLEAN SHEETS on your bed the night before your surgery  DO NOT SLEEP WITH PETS.  Day of Surgery: Take a shower with CHG soap. Do not wear jewelry or makeup Do not wear lotions, powders, perfumes, or deodorant. Do not shave 48 hours prior to surgery.   Do not bring valuables to the hospital. [facility] is not responsible for any belongings or valuables. Do not wear nail polish, gel polish, artificial nails, or any other type of covering on natural nails (fingers and toes) If you have artificial nails or gel coating that need to be removed by a nail salon, please have this removed prior to surgery. Artificial nails or gel coating may interfere with anesthesia's ability to adequately monitor your vital signs. Wear Clean/Comfortable clothing the morning of surgery Remember to brush your teeth WITH YOUR REGULAR TOOTHPASTE.   Please read over the fact sheets that you were given.

## 2022-11-01 NOTE — Progress Notes (Signed)
PCP - Terrill Mohr, MD  EKG - 08/19/22  ERAS Protcol - no drink ordered  COVID TEST- N/A   Anesthesia review: No  Patient denies shortness of breath, fever, cough and chest pain at PAT appointment   All instructions explained to the patient, with a verbal understanding of the material. Patient agrees to go over the instructions while at home for a better understanding. Patient also instructed to self quarantine after being tested for COVID-19. The opportunity to ask questions was provided.

## 2022-11-15 ENCOUNTER — Encounter (HOSPITAL_COMMUNITY): Payer: Self-pay | Admitting: Surgery

## 2022-11-15 ENCOUNTER — Other Ambulatory Visit: Payer: Self-pay

## 2022-11-15 ENCOUNTER — Encounter (HOSPITAL_COMMUNITY)
Admission: RE | Disposition: A | Discharge status: Home / Self Care | Payer: Self-pay | Source: Home / Self Care | Attending: Surgery

## 2022-11-15 ENCOUNTER — Ambulatory Visit (HOSPITAL_BASED_OUTPATIENT_CLINIC_OR_DEPARTMENT_OTHER): Payer: 59 | Admitting: Anesthesiology

## 2022-11-15 ENCOUNTER — Ambulatory Visit (HOSPITAL_COMMUNITY): Payer: 59 | Admitting: Anesthesiology

## 2022-11-15 ENCOUNTER — Ambulatory Visit (HOSPITAL_COMMUNITY)
Admission: RE | Admit: 2022-11-15 | Discharge: 2022-11-15 | Disposition: A | Discharge status: Home / Self Care | Payer: 59 | Attending: Surgery | Admitting: Surgery

## 2022-11-15 DIAGNOSIS — J45909 Unspecified asthma, uncomplicated: Secondary | ICD-10-CM

## 2022-11-15 DIAGNOSIS — Z09 Encounter for follow-up examination after completed treatment for conditions other than malignant neoplasm: Secondary | ICD-10-CM | POA: Diagnosis not present

## 2022-11-15 DIAGNOSIS — K828 Other specified diseases of gallbladder: Secondary | ICD-10-CM | POA: Diagnosis not present

## 2022-11-15 DIAGNOSIS — Z87891 Personal history of nicotine dependence: Secondary | ICD-10-CM

## 2022-11-15 DIAGNOSIS — Z9081 Acquired absence of spleen: Secondary | ICD-10-CM | POA: Insufficient documentation

## 2022-11-15 DIAGNOSIS — F418 Other specified anxiety disorders: Secondary | ICD-10-CM

## 2022-11-15 DIAGNOSIS — Z01818 Encounter for other preprocedural examination: Secondary | ICD-10-CM

## 2022-11-15 DIAGNOSIS — K8044 Calculus of bile duct with chronic cholecystitis without obstruction: Secondary | ICD-10-CM | POA: Insufficient documentation

## 2022-11-15 DIAGNOSIS — Z90411 Acquired partial absence of pancreas: Secondary | ICD-10-CM | POA: Diagnosis not present

## 2022-11-15 DIAGNOSIS — K219 Gastro-esophageal reflux disease without esophagitis: Secondary | ICD-10-CM | POA: Insufficient documentation

## 2022-11-15 DIAGNOSIS — K862 Cyst of pancreas: Secondary | ICD-10-CM | POA: Diagnosis not present

## 2022-11-15 DIAGNOSIS — Z6841 Body Mass Index (BMI) 40.0 and over, adult: Secondary | ICD-10-CM | POA: Insufficient documentation

## 2022-11-15 HISTORY — PX: CHOLECYSTECTOMY: SHX55

## 2022-11-15 LAB — CBC
HCT: 40.9 % (ref 36.0–46.0)
Hemoglobin: 13.1 g/dL (ref 12.0–15.0)
MCH: 28.5 pg (ref 26.0–34.0)
MCHC: 32 g/dL (ref 30.0–36.0)
MCV: 89.1 fL (ref 80.0–100.0)
Platelets: 575 10*3/uL — ABNORMAL HIGH (ref 150–400)
RBC: 4.59 MIL/uL (ref 3.87–5.11)
RDW: 14.2 % (ref 11.5–15.5)
WBC: 12.7 10*3/uL — ABNORMAL HIGH (ref 4.0–10.5)
nRBC: 0 % (ref 0.0–0.2)

## 2022-11-15 LAB — BASIC METABOLIC PANEL
Anion gap: 11 (ref 5–15)
BUN: 9 mg/dL (ref 6–20)
CO2: 23 mmol/L (ref 22–32)
Calcium: 9.4 mg/dL (ref 8.9–10.3)
Chloride: 103 mmol/L (ref 98–111)
Creatinine, Ser: 0.67 mg/dL (ref 0.44–1.00)
GFR, Estimated: >60 mL/min (ref >60)
Glucose, Bld: 83 mg/dL (ref 70–99)
Potassium: 3.8 mmol/L (ref 3.5–5.1)
Sodium: 137 mmol/L (ref 135–145)

## 2022-11-15 LAB — POCT PREGNANCY, URINE: Preg Test, Ur: NEGATIVE

## 2022-11-15 SURGERY — LAPAROSCOPIC CHOLECYSTECTOMY
Anesthesia: General | Site: Abdomen

## 2022-11-15 MED ORDER — PROPOFOL 10 MG/ML IV BOLUS
INTRAVENOUS | Status: DC | PRN
  Administered 2022-11-15: 200 mg via INTRAVENOUS

## 2022-11-15 MED ORDER — HYDROCODONE-ACETAMINOPHEN 5-325 MG PO TABS: 1.0000 | ORAL_TABLET | Freq: Four times a day (QID) | ORAL | 0 refills | Status: DC | PRN

## 2022-11-15 MED ORDER — MEPERIDINE HCL 25 MG/ML IJ SOLN: 6.2500 mg | INTRAMUSCULAR | Status: DC | PRN

## 2022-11-15 MED ORDER — CEFAZOLIN SODIUM-DEXTROSE 2-4 GM/100ML-% IV SOLN
2.0000 g | INTRAVENOUS | Status: AC
  Administered 2022-11-15: 2 g via INTRAVENOUS
  Filled 2022-11-15: qty 100

## 2022-11-15 MED ORDER — BUPIVACAINE HCL (PF) 0.25 % IJ SOLN
INTRAMUSCULAR | Status: AC
  Filled 2022-11-15: qty 30

## 2022-11-15 MED ORDER — DEXAMETHASONE SODIUM PHOSPHATE 10 MG/ML IJ SOLN
INTRAMUSCULAR | Status: AC
  Filled 2022-11-15: qty 1

## 2022-11-15 MED ORDER — PROPOFOL 10 MG/ML IV BOLUS
INTRAVENOUS | Status: AC
  Filled 2022-11-15: qty 20

## 2022-11-15 MED ORDER — MIDAZOLAM HCL 2 MG/2ML IJ SOLN
INTRAMUSCULAR | Status: AC
  Filled 2022-11-15: qty 2

## 2022-11-15 MED ORDER — BUPIVACAINE-EPINEPHRINE 0.5% -1:200000 IJ SOLN
INTRAMUSCULAR | Status: DC | PRN
  Administered 2022-11-15: 30 mL

## 2022-11-15 MED ORDER — PROMETHAZINE HCL 25 MG/ML IJ SOLN: 6.2500 mg | INTRAMUSCULAR | Status: DC | PRN

## 2022-11-15 MED ORDER — DEXAMETHASONE SODIUM PHOSPHATE 10 MG/ML IJ SOLN
INTRAMUSCULAR | Status: DC | PRN
  Administered 2022-11-15: 10 mg via INTRAVENOUS

## 2022-11-15 MED ORDER — SUGAMMADEX SODIUM 500 MG/5ML IV SOLN
INTRAVENOUS | Status: AC
  Filled 2022-11-15: qty 5

## 2022-11-15 MED ORDER — BUPIVACAINE-EPINEPHRINE (PF) 0.5% -1:200000 IJ SOLN
INTRAMUSCULAR | Status: AC
  Filled 2022-11-15: qty 30

## 2022-11-15 MED ORDER — MIDAZOLAM HCL 2 MG/2ML IJ SOLN
INTRAMUSCULAR | Status: DC | PRN
  Administered 2022-11-15: 2 mg via INTRAVENOUS

## 2022-11-15 MED ORDER — DEXMEDETOMIDINE HCL IN NACL 80 MCG/20ML IV SOLN
INTRAVENOUS | Status: AC
  Filled 2022-11-15: qty 20

## 2022-11-15 MED ORDER — 0.9 % SODIUM CHLORIDE (POUR BTL) OPTIME
TOPICAL | Status: DC | PRN
  Administered 2022-11-15: 1000 mL

## 2022-11-15 MED ORDER — SUGAMMADEX SODIUM 200 MG/2ML IV SOLN
INTRAVENOUS | Status: DC | PRN
  Administered 2022-11-15: 100 mg via INTRAVENOUS
  Administered 2022-11-15: 400 mg via INTRAVENOUS

## 2022-11-15 MED ORDER — OXYCODONE HCL 5 MG/5ML PO SOLN: 5.0000 mg | Freq: Once | ORAL | Status: DC | PRN

## 2022-11-15 MED ORDER — HYDROMORPHONE HCL 1 MG/ML IJ SOLN
INTRAMUSCULAR | Status: AC
  Filled 2022-11-15: qty 1

## 2022-11-15 MED ORDER — FENTANYL CITRATE (PF) 250 MCG/5ML IJ SOLN
INTRAMUSCULAR | Status: DC | PRN
  Administered 2022-11-15: 50 ug via INTRAVENOUS
  Administered 2022-11-15: 100 ug via INTRAVENOUS

## 2022-11-15 MED ORDER — HYDROMORPHONE HCL 1 MG/ML IJ SOLN
0.2500 mg | INTRAMUSCULAR | Status: DC | PRN
  Administered 2022-11-15: 0.5 mg via INTRAVENOUS

## 2022-11-15 MED ORDER — SODIUM CHLORIDE 0.9 % IR SOLN
Status: DC | PRN
  Administered 2022-11-15: 1

## 2022-11-15 MED ORDER — ROCURONIUM BROMIDE 10 MG/ML (PF) SYRINGE
PREFILLED_SYRINGE | INTRAVENOUS | Status: DC | PRN
  Administered 2022-11-15: 100 mg via INTRAVENOUS

## 2022-11-15 MED ORDER — CHLORHEXIDINE GLUCONATE 0.12 % MT SOLN
15.0000 mL | Freq: Once | OROMUCOSAL | Status: AC
  Administered 2022-11-15: 15 mL via OROMUCOSAL
  Filled 2022-11-15: qty 15

## 2022-11-15 MED ORDER — AMISULPRIDE (ANTIEMETIC) 5 MG/2ML IV SOLN
INTRAVENOUS | Status: AC
  Filled 2022-11-15: qty 4

## 2022-11-15 MED ORDER — OXYCODONE HCL 5 MG PO TABS: 5.0000 mg | ORAL_TABLET | Freq: Once | ORAL | Status: DC | PRN

## 2022-11-15 MED ORDER — ORAL CARE MOUTH RINSE: 15.0000 mL | Freq: Once | OROMUCOSAL | Status: AC

## 2022-11-15 MED ORDER — ONDANSETRON HCL 4 MG/2ML IJ SOLN
INTRAMUSCULAR | Status: DC | PRN
  Administered 2022-11-15: 4 mg via INTRAVENOUS

## 2022-11-15 MED ORDER — LIDOCAINE 2% (20 MG/ML) 5 ML SYRINGE
INTRAMUSCULAR | Status: DC | PRN
  Administered 2022-11-15: 100 mg via INTRAVENOUS

## 2022-11-15 MED ORDER — LIDOCAINE 2% (20 MG/ML) 5 ML SYRINGE
INTRAMUSCULAR | Status: AC
  Filled 2022-11-15: qty 5

## 2022-11-15 MED ORDER — AMISULPRIDE (ANTIEMETIC) 5 MG/2ML IV SOLN
10.0000 mg | Freq: Once | INTRAVENOUS | Status: AC | PRN
  Administered 2022-11-15: 10 mg via INTRAVENOUS

## 2022-11-15 MED ORDER — LACTATED RINGERS IV SOLN: INTRAVENOUS | Status: DC

## 2022-11-15 MED ORDER — ROCURONIUM BROMIDE 10 MG/ML (PF) SYRINGE
PREFILLED_SYRINGE | INTRAVENOUS | Status: AC
  Filled 2022-11-15: qty 10

## 2022-11-15 MED ORDER — CELECOXIB 200 MG PO CAPS
200.0000 mg | ORAL_CAPSULE | ORAL | Status: AC
  Administered 2022-11-15: 200 mg via ORAL
  Filled 2022-11-15: qty 1

## 2022-11-15 MED ORDER — ONDANSETRON HCL 4 MG/2ML IJ SOLN
INTRAMUSCULAR | Status: AC
  Filled 2022-11-15: qty 2

## 2022-11-15 MED ORDER — FENTANYL CITRATE (PF) 250 MCG/5ML IJ SOLN
INTRAMUSCULAR | Status: AC
  Filled 2022-11-15: qty 5

## 2022-11-15 MED ORDER — ACETAMINOPHEN 500 MG PO TABS
1000.0000 mg | ORAL_TABLET | ORAL | Status: AC
  Administered 2022-11-15: 1000 mg via ORAL
  Filled 2022-11-15: qty 2

## 2022-11-15 SURGICAL SUPPLY — 47 items
ADH SKN CLS APL DERMABOND .7 (GAUZE/BANDAGES/DRESSINGS) ×1
APL PRP STRL LF DISP 70% ISPRP (MISCELLANEOUS) ×1
APPLIER CLIP 5 13 M/L LIGAMAX5 (MISCELLANEOUS) ×1
APR CLP MED LRG 5 ANG JAW (MISCELLANEOUS) ×1
BAG COUNTER SPONGE SURGICOUNT (BAG) ×1 IMPLANT
BAG SPEC RTRVL 10 TROC 200 (ENDOMECHANICALS)
BAG SPNG CNTER NS LX DISP (BAG) ×1
BLADE CLIPPER SURG (BLADE) IMPLANT
CANISTER SUCT 3000ML PPV (MISCELLANEOUS) ×1 IMPLANT
CHLORAPREP W/TINT 26 (MISCELLANEOUS) ×1 IMPLANT
CLIP APPLIE 5 13 M/L LIGAMAX5 (MISCELLANEOUS) ×1 IMPLANT
COVER SURGICAL LIGHT HANDLE (MISCELLANEOUS) ×1 IMPLANT
DERMABOND ADVANCED .7 DNX12 (GAUZE/BANDAGES/DRESSINGS) ×1 IMPLANT
ELECT REM PT RETURN 9FT ADLT (ELECTROSURGICAL) ×1
ELECTRODE REM PT RTRN 9FT ADLT (ELECTROSURGICAL) ×1 IMPLANT
GLOVE BIOGEL PI IND STRL 6 (GLOVE) ×1 IMPLANT
GLOVE BIOGEL PI MICRO STRL 5.5 (GLOVE) ×1 IMPLANT
GOWN STRL REUS W/ TWL LRG LVL3 (GOWN DISPOSABLE) ×3 IMPLANT
GOWN STRL REUS W/TWL LRG LVL3 (GOWN DISPOSABLE) ×4
GRASPER SUT TROCAR 14GX15 (MISCELLANEOUS) IMPLANT
IRRIG SUCT STRYKERFLOW 2 WTIP (MISCELLANEOUS) ×1
IRRIGATION SUCT STRKRFLW 2 WTP (MISCELLANEOUS) ×1 IMPLANT
KIT BASIN OR (CUSTOM PROCEDURE TRAY) ×1 IMPLANT
KIT TURNOVER KIT B (KITS) ×1 IMPLANT
L-HOOK LAP DISP 36CM (ELECTROSURGICAL) ×1
LHOOK LAP DISP 36CM (ELECTROSURGICAL) ×1 IMPLANT
NDL INSUFFLATION 14GA 120MM (NEEDLE) IMPLANT
NEEDLE INSUFFLATION 14GA 120MM (NEEDLE) ×1 IMPLANT
NS IRRIG 1000ML POUR BTL (IV SOLUTION) ×1 IMPLANT
PAD ARMBOARD 7.5X6 YLW CONV (MISCELLANEOUS) ×1 IMPLANT
PENCIL BUTTON HOLSTER BLD 10FT (ELECTRODE) ×1 IMPLANT
POUCH RETRIEVAL ECOSAC 10 (ENDOMECHANICALS) IMPLANT
POUCH RETRIEVAL ECOSAC 10MM (ENDOMECHANICALS)
SCISSORS LAP 5X35 DISP (ENDOMECHANICALS) ×1 IMPLANT
SET TUBE SMOKE EVAC HIGH FLOW (TUBING) ×1 IMPLANT
SLEEVE Z-THREAD 5X100MM (TROCAR) ×2 IMPLANT
SUT MNCRL AB 4-0 PS2 18 (SUTURE) ×1 IMPLANT
SYS BAG RETRIEVAL 10MM (BASKET) ×1
SYSTEM BAG RETRIEVAL 10MM (BASKET) IMPLANT
TOWEL GREEN STERILE (TOWEL DISPOSABLE) ×1 IMPLANT
TOWEL GREEN STERILE FF (TOWEL DISPOSABLE) ×1 IMPLANT
TRAY LAPAROSCOPIC MC (CUSTOM PROCEDURE TRAY) ×1 IMPLANT
TROCAR BALLN 12MMX100 BLUNT (TROCAR) ×1 IMPLANT
TROCAR Z THREAD OPTICAL 12X100 (TROCAR) IMPLANT
TROCAR Z-THREAD OPTICAL 5X100M (TROCAR) ×1 IMPLANT
WARMER LAPAROSCOPE (MISCELLANEOUS) ×1 IMPLANT
WATER STERILE IRR 1000ML POUR (IV SOLUTION) ×1 IMPLANT

## 2022-11-15 NOTE — Interval H&P Note (Signed)
History and Physical Interval Note:  11/15/2022 7:09 AM  Kiara Harris  has presented today for surgery, with the diagnosis of BILIARY COLIC.  The various methods of treatment have been discussed with the patient and family. After consideration of risks, benefits and other options for treatment, the patient has consented to  Procedure(s): LAPAROSCOPIC CHOLECYSTECTOMY (N/A) as a surgical intervention.  The patient's history has been reviewed, patient examined, no change in status, stable for surgery.  I have reviewed the patient's chart and labs.  Questions were answered to the patient's satisfaction.     Fritzi Mandes

## 2022-11-15 NOTE — Anesthesia Preprocedure Evaluation (Signed)
Anesthesia Evaluation  Patient identified by MRN, date of birth, ID band Patient awake    Reviewed: Allergy & Precautions, NPO status , Patient's Chart, lab work & pertinent test results  Airway Mallampati: I  TM Distance: >3 FB Neck ROM: Full    Dental  (+) Teeth Intact, Dental Advisory Given   Pulmonary asthma , former smoker   breath sounds clear to auscultation       Cardiovascular negative cardio ROS  Rhythm:Regular Rate:Normal     Neuro/Psych  Headaches PSYCHIATRIC DISORDERS Anxiety Depression       GI/Hepatic Neg liver ROS,GERD  Medicated,,  Endo/Other    Morbid obesity  Renal/GU negative Renal ROS     Musculoskeletal negative musculoskeletal ROS (+)    Abdominal  (+) + obese  Peds  Hematology negative hematology ROS (+)   Anesthesia Other Findings   Reproductive/Obstetrics                             Anesthesia Physical Anesthesia Plan  ASA: 3  Anesthesia Plan: General   Post-op Pain Management: Tylenol PO (pre-op)*, Celebrex PO (pre-op)* and Dilaudid IV   Induction: Intravenous  PONV Risk Score and Plan: 3 and Ondansetron, Dexamethasone, Midazolam and Treatment may vary due to age or medical condition  Airway Management Planned: Oral ETT  Additional Equipment: Arterial line  Intra-op Plan:   Post-operative Plan: Extubation in OR  Informed Consent: I have reviewed the patients History and Physical, chart, labs and discussed the procedure including the risks, benefits and alternatives for the proposed anesthesia with the patient or authorized representative who has indicated his/her understanding and acceptance.     Dental advisory given  Plan Discussed with: CRNA  Anesthesia Plan Comments:        Anesthesia Quick Evaluation

## 2022-11-15 NOTE — Op Note (Signed)
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 She has since developed postprandial RUQ abdominal pain. CT and [place] were unremarkable, and a HIDA showed a borderline low EF. Her symptoms are very consistent with biliary colic and have not improved with PPI treatment. After a discussion of the risks and benefits of surgery, she agreed to proceed with cholecystectomy.  Findings: No evidence of cholecystitis.  Procedure details: Informed consent was obtained in the preoperative area prior to the procedure. The patient was brought to the operating room and placed on the table in the supine position. General anesthesia was induced and appropriate lines and drains were placed for intraoperative monitoring. Perioperative antibiotics were administered per SCIP guidelines. The abdomen was prepped and draped in the usual sterile fashion. A pre-procedure timeout was taken verifying patient identity, surgical site and procedure to be performed.  A small infraumbilical skin incision was made, the subcutaneous tissue was bluntly spread, and the umbilical stalk was grasped and elevated. A Veress needle was inserted through the fascia and intraperitoneal placement was confirmed with the saline drop test. The abdomen was insufflated and a 41mm Visiport was placed. The peritoneal cavity was inspected with no evidence of visceral or vascular injury. Two 4mm ports were placed in the right subcostal margin under direct visualization, and the umbilical port was then upsized to a 85mm port under direct visualization. A 72mm  port was then placed in the subxiphoid position under direct visualization. The fundus of the gallbladder was grasped and retracted cephalad. The infundibulum was retracted laterally. The peritoneum over the gallbladder was opened with cautery, and the cystic triangle was dissected out using cautery and blunt dissection. The critical view of safety was obtained. The cystic duct and cystic artery were clipped and ligated, leaving two clips behind on the cystic duct stump. The gallbladder was taken off the liver using cautery, and the specimen was placed in an endocatch bag and removed via the umbilical port site. The surgical site was irrigated with saline until the effluent was clear. The gallbladder fossa was hemostatic. The cystic duct and artery stumps were visually inspected and there was no evidence of bile leak or bleeding. The umbilical port site fascia was closed with a 0 Vicryl figure-of-eight suture using a PMI. The remaining ports were removed and the abdomen was desufflated. The skin at all port sites was closed with 4-0 monocryl subcuticular suture. Dermabond was applied.  The patient tolerated the procedure well with no apparent complications. All counts were correct x2 at the end of the procedure. The patient was extubated and taken to PACU in stable condition.  Sophronia Simas, MD 11/15/22 9:17 AM

## 2022-11-15 NOTE — Anesthesia Procedure Notes (Signed)
Procedure Name: Intubation Date/Time: 11/15/2022 7:37 AM  Performed by: Shary Decamp, CRNAPre-anesthesia Checklist: Patient identified, Patient being monitored, Timeout performed, Emergency Drugs available and Suction available Patient Re-evaluated:Patient Re-evaluated prior to induction Oxygen Delivery Method: Circle System Utilized Preoxygenation: Pre-oxygenation with 100% oxygen Induction Type: IV induction Ventilation: Mask ventilation without difficulty Laryngoscope Size: Miller and 2 Grade View: Grade I Tube type: Oral Tube size: 7.0 mm Number of attempts: 1 Airway Equipment and Method: Stylet Placement Confirmation: ETT inserted through vocal cords under direct vision, positive ETCO2 and breath sounds checked- equal and bilateral Secured at: 21 cm Tube secured with: Tape Dental Injury: Teeth and Oropharynx as per pre-operative assessment

## 2022-11-15 NOTE — Discharge Instructions (Signed)
[facility] DISCHARGE INSTRUCTIONS  Activity No heavy lifting greater than 15 pounds for 4 weeks after surgery. Ok to shower in 24 hours, but do not bathe or submerge incisions underwater. Do not drive while taking narcotic pain medication.  Wound Care Your incisions are covered with skin glue called Dermabond. This will peel off on its own over time. You may shower and allow warm soapy water to run over your incisions. Gently pat dry. Do not submerge your incision underwater. Monitor your incision for any new redness, tenderness, or drainage.  When to Call us: Fever greater than 100.5 New redness, drainage, or swelling at incision site Severe pain, nausea, or vomiting Jaundice (yellowing of the whites of the eyes or skin)  Follow-up You have an appointment scheduled with Dr. Freida Busman on Dec 22, 2022 at 9:20am. This will be at the [facility] Surgery office at [address], [place], [place]. Please arrive at least 15 minutes prior to your scheduled appointment time.  For questions or concerns, please call the office at [phone].

## 2022-11-15 NOTE — Transfer of Care (Signed)
Immediate Anesthesia Transfer of Care Note  Patient: Kiara Harris  Procedure(s) Performed: LAPAROSCOPIC CHOLECYSTECTOMY (Abdomen)  Patient Location: PACU  Anesthesia Type:General  Level of Consciousness: awake, alert , oriented, patient cooperative, and responds to stimulation  Airway & Oxygen Therapy: Patient Spontanous Breathing and Patient connected to nasal cannula oxygen  Post-op Assessment: Report given to RN, Post -op Vital signs reviewed and stable, and Patient moving all extremities X 4  Post vital signs: Reviewed and stable  Last Vitals:  Vitals Value Taken Time  BP 115/60 11/15/22 0845  Temp    Pulse 95 11/15/22 0846  Resp 23 11/15/22 0846  SpO2 99 % 11/15/22 0846  Vitals shown include unvalidated device data.  Last Pain:  Vitals:   11/15/22 0612  TempSrc:   PainSc: 4          Complications: No notable events documented.

## 2022-11-15 NOTE — Anesthesia Postprocedure Evaluation (Signed)
Anesthesia Post Note  Patient: Kiara Harris  Procedure(s) Performed: LAPAROSCOPIC CHOLECYSTECTOMY (Abdomen)     Patient location during evaluation: PACU Anesthesia Type: General Level of consciousness: awake and alert Pain management: pain level controlled Vital Signs Assessment: post-procedure vital signs reviewed and stable Respiratory status: spontaneous breathing, nonlabored ventilation and respiratory function stable Cardiovascular status: blood pressure returned to baseline and stable Postop Assessment: no apparent nausea or vomiting Anesthetic complications: no   No notable events documented.  Last Vitals:  Vitals:   11/15/22 0930 11/15/22 0945  BP: 132/74 138/80  Pulse: 73 61  Resp: 19 20  Temp:  36.9 C  SpO2: 99% 99%    Last Pain:  Vitals:   11/15/22 0945  TempSrc:   PainSc: 3                  Lowella Curb

## 2022-11-16 ENCOUNTER — Encounter (HOSPITAL_COMMUNITY): Payer: Self-pay | Admitting: Surgery

## 2022-11-16 LAB — SURGICAL PATHOLOGY

## 2023-01-13 IMAGING — MR MR ABDOMEN WO/W CM
12 of 20 series · 25 of 48 positions shown · IV contrast (20ml Multihance)
Comparison: MRI 08/07/2021

CLINICAL DATA: Pancreatic cystic mass.  Routine surveillance.

EXAM:
MRI ABDOMEN WITHOUT AND WITH CONTRAST
TECHNIQUE: Multiplanar multisequence MR imaging of the abdomen was performed
both before and after the administration of intravenous contrast.
CONTRAST:  20mL MULTIHANCE GADOBENATE DIMEGLUMINE 529 MG/ML IV SOLN.
Mild contrast reaction documented on 08/07/2021. Patient received
standard prophylactic contrast reaction premedication. No adverse
reaction reported.

[Series 3: cor haste · coronal · 5.0mm · 0.68mm/px · 1 of 40 slices shown]
[im 1/40]
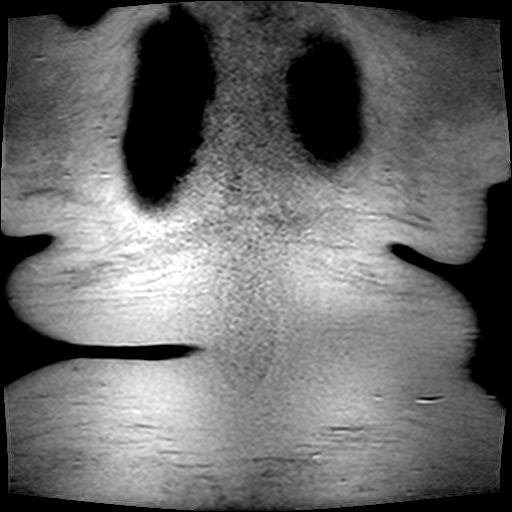

[Series 4: axial haste · axial · 6.0mm · 0.68mm/px · 1 of 36 slices shown]
[im 1/36]
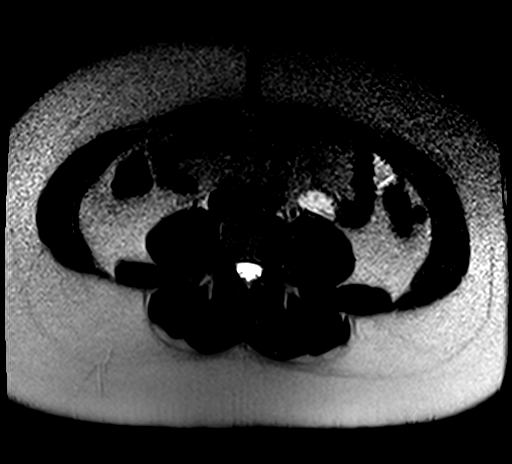

[Series 5: T1 · axial · 6.0mm · 0.70mm/px · 1 of 68 slices shown]
[im 1/68]
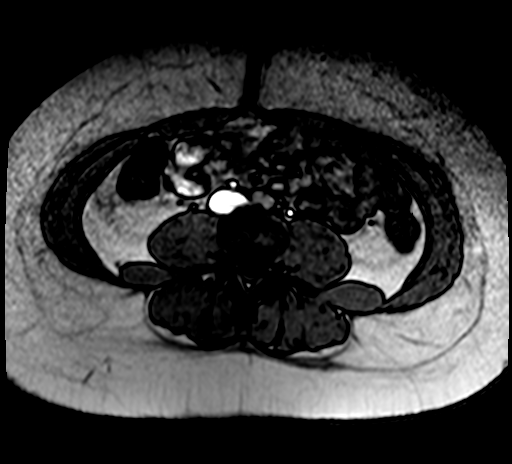

[Series 6: T2 · coronal · 3.0mm · 0.70mm/px · 2 of 47 slices shown (1 of 2)]
[im 1/47]
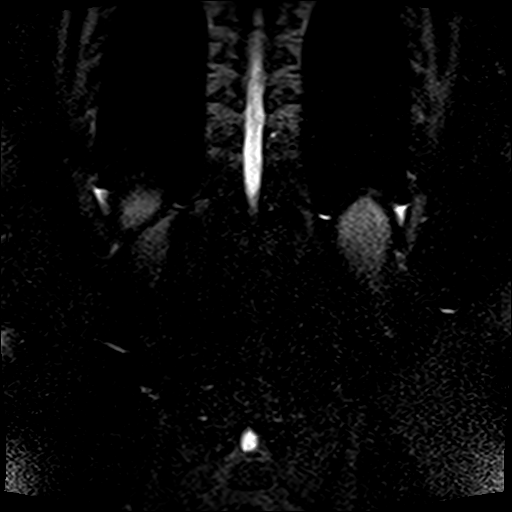
[im 47/47]
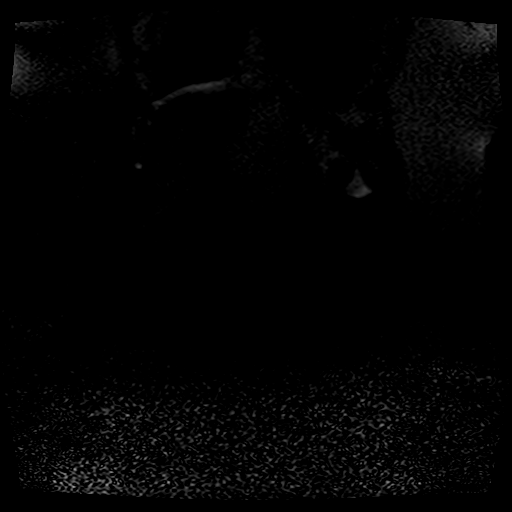

[Series 7: T2 · axial · 6.0mm · 1.12mm/px · 1 of 34 slices shown (2 of 2)]
[im 1/34]
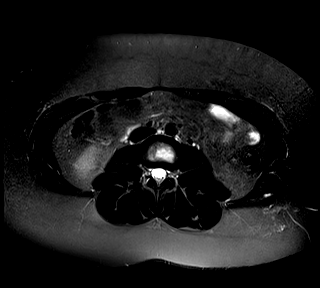

[Series 10: ep2d_diff_b50_500_800_p2_trig · axial · 6.0mm · 1.88mm/px · z∈[-86,+125]mm · 3 of 99 slices shown]
[im 1/99]
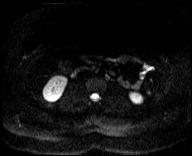
[im 50/99]
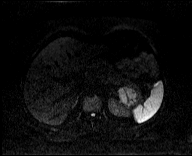
[im 99/99]
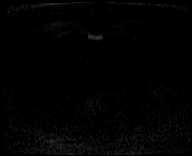

[Series 11: ep2d_diff_b50_500_800_p2_trig_adc · axial · 6.0mm · 1.88mm/px · 1 of 33 slices shown]
[im 1/33]
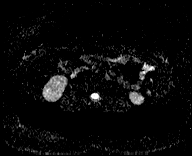

[Series 14: T1 dynamic · axial · non-contrast · 2.5mm · 0.70mm/px · z∈[-142,+75]mm · 3 of 88 slices shown]
[im 1/88]
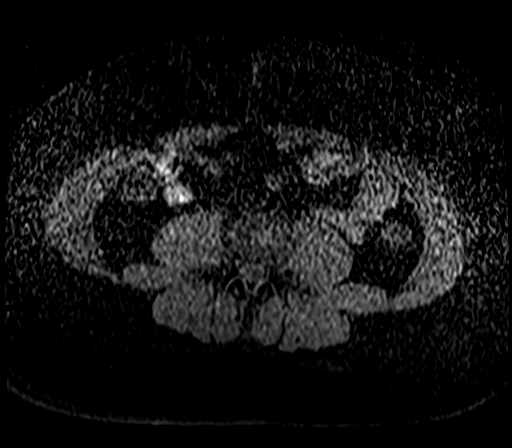
[im 44/88]
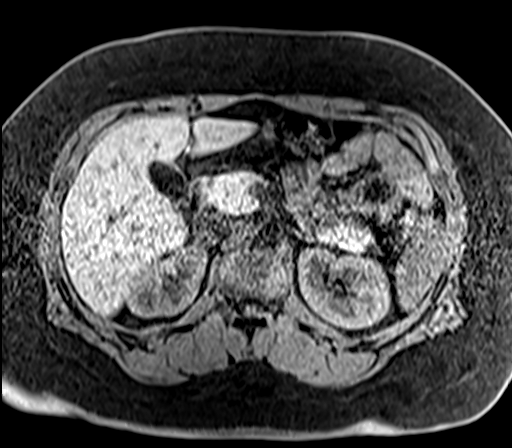
[im 88/88]
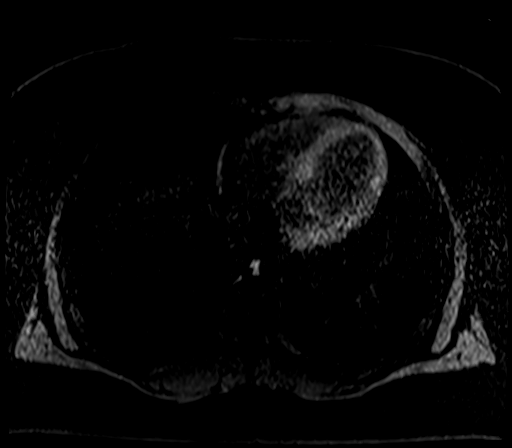

[Series 15: T1 dynamic post-contrast · axial · 2.5mm · 0.70mm/px · z∈[-142,+75]mm · 3 of 88 slices shown (1 of 4)]
[im 1/88]
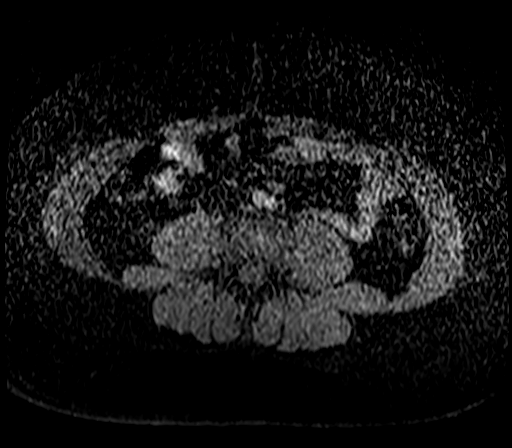
[im 44/88]
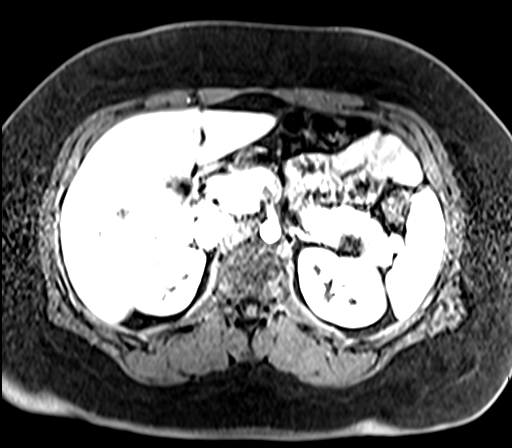
[im 88/88]
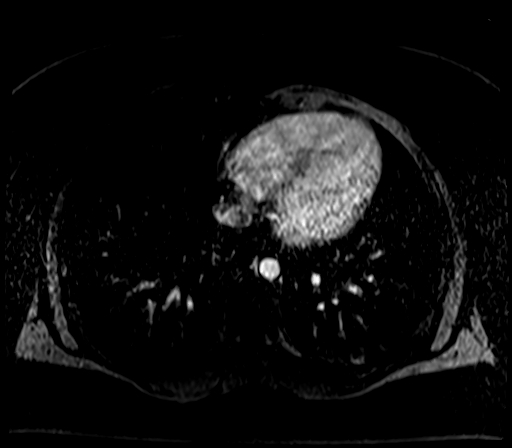

[Series 16: T1 dynamic post-contrast · axial · 2.5mm · 0.70mm/px · z∈[-142,+75]mm · 3 of 88 slices shown (2 of 4)]
[im 1/88]
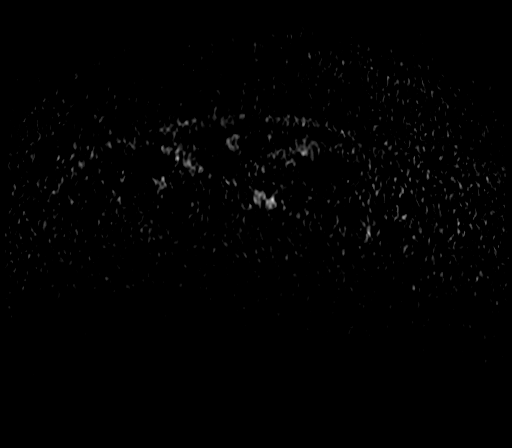
[im 44/88]
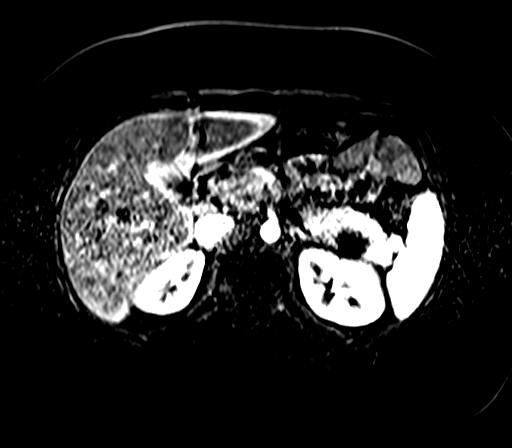
[im 88/88]
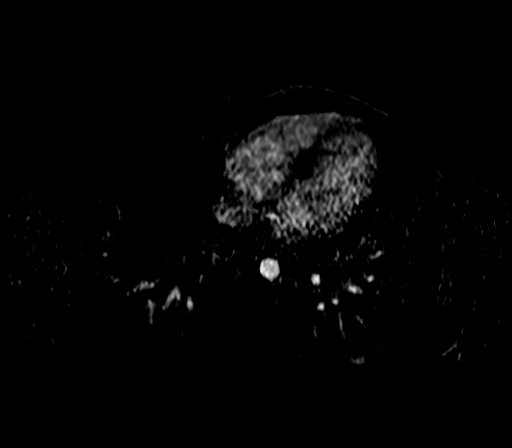

[Series 17: T1 dynamic post-contrast · axial · 2.5mm · 0.70mm/px · z∈[-142,+75]mm · 3 of 88 slices shown (3 of 4)]
[im 1/88]
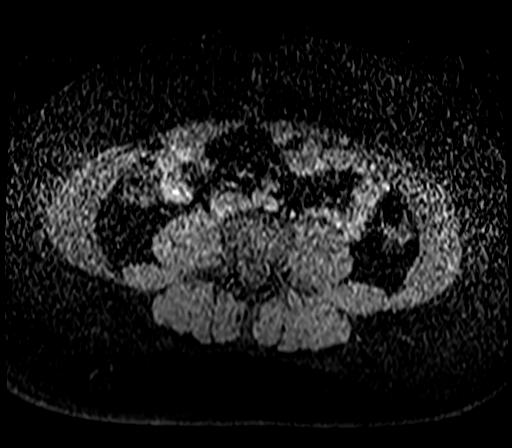
[im 44/88]
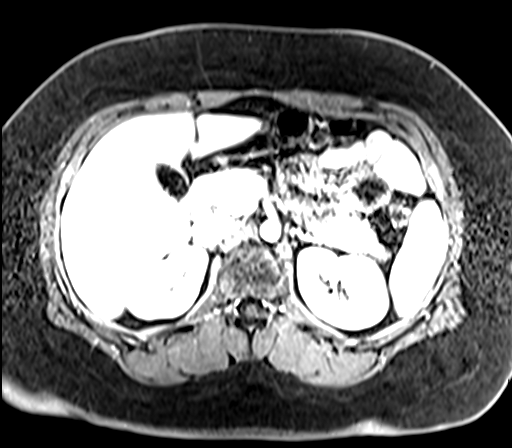
[im 88/88]
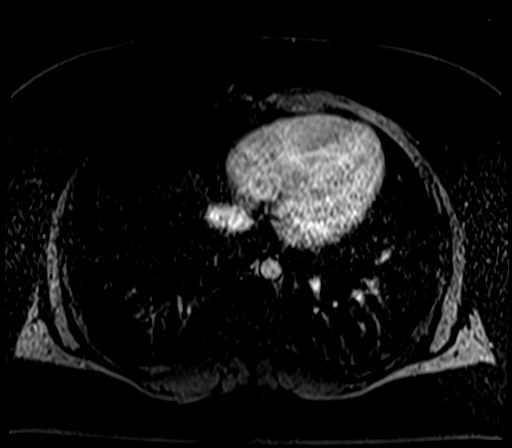

[Series 18: T1 dynamic post-contrast · axial · 2.5mm · 0.70mm/px · z∈[-142,+75]mm · 3 of 88 slices shown (4 of 4)]
[im 1/88]
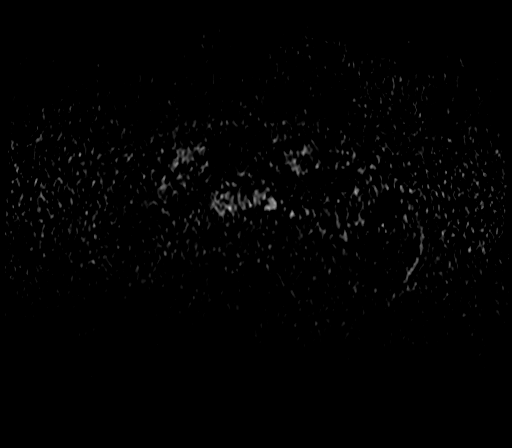
[im 44/88]
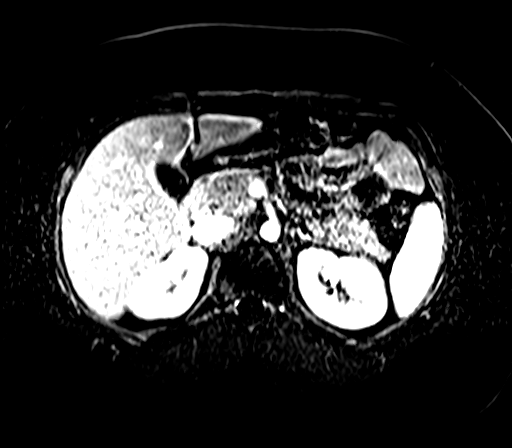
[im 88/88]
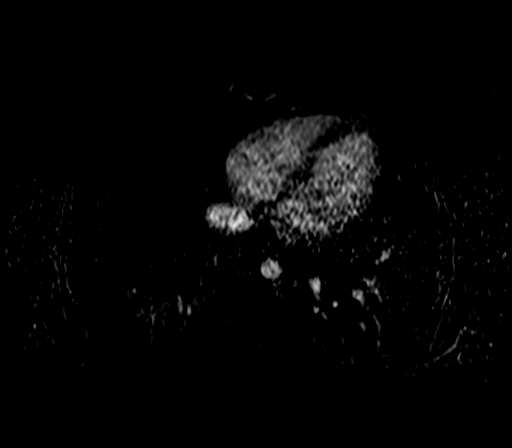

[25 of 48 positions shown; findings below may reference images not displayed]

FINDINGS: Lower chest:  Lung bases are clear.

Hepatobiliary: No focal hepatic lesion. No biliary duct dilatation.
Gallbladder normal. Common bile duct normal.

Pancreas: Again demonstrated multilobulated cystic mass presumably
arising from the tail the pancreas but bordering the LEFT kidney.
Mass is measures 4.0 x 3.8 by 3.2 cm (image 17/series 4) compared to
4.0 x 3.7 by 3.0 cm. Visually mass appears unchanged. Mass lesion
is hyperintense on T2 weighted imaging wit internal spoke like
septations. Septations are unchanged from comparison exam.
Postcontrast enhanced imaging demonstrates no internal enhancement
or nodularity. The septations are barely perceptible on the
postcontrast imaging (series 22 for example).

There is no pancreatic duct dilatation.

Spleen: Normal spleen.

Adrenals/urinary tract: Adrenal glands normal. A cystic pancreatic
mass abuts the anterior margin of the LEFT kidney. LEFT and RIGHT
kidney otherwise normal.

Stomach/Bowel: Stomach and limited of the small bowel is
unremarkable

Vascular/Lymphatic: Abdominal aortic normal caliber. No
retroperitoneal periportal lymphadenopathy.

Musculoskeletal: No aggressive osseous lesion
IMPRESSION: 1. Stable complex cystic mass with internal septations located at
the tail the pancreas. Favor pancreatic cystic neoplasm however
cannot exclude exophytic LEFT renal lesion as described on
comparison MRI. No change in differential diagnosis and
recommendation.
2. No pancreatic duct dilatation.
3. Normal liver and biliary tree.

## 2023-01-28 ENCOUNTER — Encounter: Payer: Self-pay | Admitting: Podiatry

## 2023-01-28 ENCOUNTER — Other Ambulatory Visit: Payer: Self-pay | Admitting: Podiatry

## 2023-01-28 ENCOUNTER — Ambulatory Visit (INDEPENDENT_AMBULATORY_CARE_PROVIDER_SITE_OTHER): Payer: 59 | Admitting: Podiatry

## 2023-01-28 ENCOUNTER — Ambulatory Visit (INDEPENDENT_AMBULATORY_CARE_PROVIDER_SITE_OTHER): Payer: 59

## 2023-01-28 DIAGNOSIS — M79671 Pain in right foot: Secondary | ICD-10-CM

## 2023-01-28 DIAGNOSIS — M79672 Pain in left foot: Secondary | ICD-10-CM

## 2023-01-28 DIAGNOSIS — M7671 Peroneal tendinitis, right leg: Secondary | ICD-10-CM

## 2023-01-28 DIAGNOSIS — S92354A Nondisplaced fracture of fifth metatarsal bone, right foot, initial encounter for closed fracture: Secondary | ICD-10-CM

## 2023-01-28 MED ORDER — TRIAMCINOLONE ACETONIDE 10 MG/ML IJ SUSP
10.0000 mg | Freq: Once | INTRAMUSCULAR | Status: AC
Start: 2023-01-28 — End: 2023-01-28
  Administered 2023-01-28: 10 mg

## 2023-01-30 NOTE — Progress Notes (Signed)
Subjective:   Patient ID: Kiara Harris, female   DOB: 22 y.o.   MRN: [id number]   HPI Patient presents stating that she is developed a lot of pain in the outside of her right foot that is been present for a while and has gotten bad recently.  She is wearing a boot but that still not helping had a history of the same kind of problem with the left foot and it eventually got better even though it still hurts some and was given tentative diagnosis of fracture.  No history of injury presents with mother and just does e-cigarettes and tries to be active   Review of Systems  All other systems reviewed and are negative.       Objective:  Physical Exam Vitals and nursing note reviewed.  Constitutional:      Appearance: She is well-developed.  Pulmonary:     Effort: Pulmonary effort is normal.  Musculoskeletal:        General: Normal range of motion.  Skin:    General: Skin is warm.  Neurological:     Mental Status: She is alert.   Neurovascular status intact muscle strength was adequate range of motion adequate with pain which extends from the base of the fifth metatarsal bilateral with the right being worse than the left.  States it is very sore and makes it hard to be active and she does not remember any type of injury and had the same problem on the left 1.  Good digital perfusion well-oriented      Assessment:  Difficult condition possibility for inflammatory peroneal tendinitis or gait with this occurring in both feet indicating most likely it is mechanical     Plan:  H&P and shows bilateral that there is what appears to be a growth plate issue base of fifth metatarsal with inflammation associated with the growth plate problem.  I cannot rule out a fracture but does not make much sense with no swelling or history of trauma.  I discussed this with him at great length and reviewed their x-rays and I went ahead today and I have recommended steroid injection as it has been there for  a while with boot usage as her other 1 is no longer effective for her and she wants a new boot and I did sterile prep injected the peroneal 3 mg Dexasone Kenalog 5 mg Xylocaine near insertion and patient does understand risk associated with this and healing issues.  I then applied air fracture walker short new which is much more comfortable for her and I utilized orthotics that were gena put valgus wedging on to try to take pressure off the area.  Reappoint to recheck  X-rays indicate that there is what appears to be a growth plate issue with a separated base of fifth metatarsal bilateral

## 2023-03-15 ENCOUNTER — Ambulatory Visit (INDEPENDENT_AMBULATORY_CARE_PROVIDER_SITE_OTHER): Payer: 59

## 2023-03-15 ENCOUNTER — Ambulatory Visit (INDEPENDENT_AMBULATORY_CARE_PROVIDER_SITE_OTHER): Payer: 59 | Admitting: Radiology

## 2023-03-15 VITALS — BP 110/76

## 2023-03-15 DIAGNOSIS — N939 Abnormal uterine and vaginal bleeding, unspecified: Secondary | ICD-10-CM

## 2023-03-15 DIAGNOSIS — R5382 Chronic fatigue, unspecified: Secondary | ICD-10-CM | POA: Diagnosis not present

## 2023-03-15 DIAGNOSIS — M79671 Pain in right foot: Secondary | ICD-10-CM

## 2023-03-15 DIAGNOSIS — L700 Acne vulgaris: Secondary | ICD-10-CM | POA: Diagnosis not present

## 2023-03-15 DIAGNOSIS — M7671 Peroneal tendinitis, right leg: Secondary | ICD-10-CM

## 2023-03-15 DIAGNOSIS — S92354A Nondisplaced fracture of fifth metatarsal bone, right foot, initial encounter for closed fracture: Secondary | ICD-10-CM

## 2023-03-15 LAB — PREGNANCY, URINE: Preg Test, Ur: NEGATIVE

## 2023-03-15 NOTE — Progress Notes (Signed)
Patient presents today to pick up custom molded foot orthotics, diagnosed with Peroneal tendonitis BIL Right foot th MT FX, pain by Dr. Charlsie Merles .   Orthotics were dispensed and fit was satisfactory. Reviewed instructions for break-in and wear. Written instructions given to patient.  Patient will follow up as needed.   Kiara Harris Cped, CFo, CFm

## 2023-03-15 NOTE — Progress Notes (Signed)
      Subjective: Kiara Harris is a 22 y.o. female who complains of vaginal bleeding every 2 weeks x's 2 months, increased fatigue. BC: condoms. Had splenectomy, cholecystectomy and partial pancreas removal earlier this year. Has not had endocrinology follow up.    Review of Systems  All other systems reviewed and are negative.   Past Medical History:  Diagnosis Date   Acid reflux    Anxiety and depression    Asthma    Cancer (HCC)    Pancreatic mucinous cystic neoplasm   Family history of adverse reaction to anesthesia    Mother trouble waking   Migraine    PCOS (polycystic ovarian syndrome)       Objective:  Today's Vitals   03/15/23 1446  BP: 110/76   There is no height or weight on file to calculate BMI.   -General: no acute distress -Vulva: without lesions or discharge -Vagina: no discharge present, scant blood in vault -Cervix: no lesion or discharge, no CMT -Perineum: no lesions -Uterus: Mobile, non tender -Adnexa: no masses or tenderness  Raynelle Fanning, CMA present for exam  Assessment:/Plan:   1. Abnormal vaginal bleeding - Pregnancy, urine - CBC - Ferritin - US PELVIC COMPLETE WITH TRANSVAGINAL; Future - Thyroid Panel With TSH - B12 and Folate Panel  2. Morbid obesity (HCC) - HgB A1c  3.Acne vulgaris - Testos,Total,Free and SHBG (Female)  4. Chronic fatigue - B12 and Folate Panel   Will contact with results once received and review plan with patient at that time.

## 2023-03-21 ENCOUNTER — Other Ambulatory Visit: Payer: 59

## 2023-03-21 ENCOUNTER — Other Ambulatory Visit: Payer: Self-pay | Admitting: *Deleted

## 2023-03-21 DIAGNOSIS — N939 Abnormal uterine and vaginal bleeding, unspecified: Secondary | ICD-10-CM

## 2023-03-21 DIAGNOSIS — R5382 Chronic fatigue, unspecified: Secondary | ICD-10-CM

## 2023-03-21 DIAGNOSIS — L700 Acne vulgaris: Secondary | ICD-10-CM

## 2023-03-21 LAB — CBC
HCT: 45.7 % — ABNORMAL HIGH (ref 35.0–45.0)
Hemoglobin: 14.9 g/dL (ref 11.7–15.5)
MCH: 29 pg (ref 27.0–33.0)
MCHC: 32.6 g/dL (ref 32.0–36.0)
MCV: 88.9 fL (ref 80.0–100.0)
MPV: 9.6 fL (ref 7.5–12.5)
Platelets: 605 10*3/uL — ABNORMAL HIGH (ref 140–400)
RBC: 5.14 10*6/uL — ABNORMAL HIGH (ref 3.80–5.10)
RDW: 13.2 % (ref 11.0–15.0)
WBC: 10.9 10*3/uL — ABNORMAL HIGH (ref 3.8–10.8)

## 2023-03-21 NOTE — Addendum Note (Signed)
Addended by: Rushie Goltz on: 03/21/2023 09:50 AM   Modules accepted: Orders

## 2023-03-22 ENCOUNTER — Other Ambulatory Visit: Payer: Self-pay | Admitting: Radiology

## 2023-03-24 ENCOUNTER — Ambulatory Visit (HOSPITAL_COMMUNITY)
Admission: RE | Admit: 2023-03-24 | Discharge: 2023-03-24 | Disposition: A | Discharge status: Home / Self Care | Payer: 59 | Source: Ambulatory Visit | Attending: Radiology | Admitting: Radiology

## 2023-03-24 DIAGNOSIS — N939 Abnormal uterine and vaginal bleeding, unspecified: Secondary | ICD-10-CM | POA: Insufficient documentation

## 2023-04-07 MED ORDER — NORETHIN-ETH ESTRAD-FE BIPHAS 1 MG-10 MCG / 10 MCG PO TABS: 1.0000 | ORAL_TABLET | Freq: Every day | ORAL | 11 refills | Status: DC

## 2023-04-07 NOTE — Telephone Encounter (Signed)
Pt is requesting to resend Lo loesterin prescription. [facility] did not receive. Sending to provider for approval

## 2023-07-28 ENCOUNTER — Ambulatory Visit: Payer: 59 | Admitting: Neurology

## 2023-07-28 ENCOUNTER — Encounter: Payer: Self-pay | Admitting: Neurology

## 2023-07-28 VITALS — BP 136/80 | HR 68 | Ht 66.0 in | Wt 276.0 lb

## 2023-07-28 DIAGNOSIS — G43709 Chronic migraine without aura, not intractable, without status migrainosus: Secondary | ICD-10-CM

## 2023-07-28 MED ORDER — ONDANSETRON 4 MG PO TBDP: 8.0000 mg | ORAL_TABLET | Freq: Three times a day (TID) | ORAL | 6 refills | Status: AC | PRN

## 2023-07-28 MED ORDER — TIZANIDINE HCL 4 MG PO TABS: 4.0000 mg | ORAL_TABLET | Freq: Four times a day (QID) | ORAL | 6 refills | Status: AC | PRN

## 2023-07-28 MED ORDER — RIZATRIPTAN BENZOATE 10 MG PO TBDP: 10.0000 mg | ORAL_TABLET | ORAL | 6 refills | Status: AC | PRN

## 2023-07-28 MED ORDER — TOPIRAMATE 50 MG PO TABS: 100.0000 mg | ORAL_TABLET | Freq: Every evening | ORAL | 11 refills | Status: AC

## 2023-07-28 NOTE — Progress Notes (Signed)
Chief Complaint  Patient presents with   Migraine    Rm 15 with mother Pt is well, reports she has been migraines for about 2 yrs. She has at least 1 a week, Associated sensitivity to light, sound and nausea.    ASSESSMENT AND PLAN  Kiara Harris is a 22 y.o. female   Chronic migraine headache Obesity  Normal neurological examination, including funduscopic examination, pending ophthalmology evaluation on August 09, 2023, ask her to fax record over  Preventive medication Topamax 50 mg every night titrating to 100 mg every night  Suboptimal response to Imitrex, Bernita Raisin, will try Maxalt 10 mg as needed, may combine with tizanidine Zofran Aleve as needed for severe prolonged headaches  Return To Clinic With NP In 6 Months   DIAGNOSTIC DATA (LABS, IMAGING, TESTING) - I reviewed patient records, labs, notes, testing and imaging myself where available.   MEDICAL HISTORY:  Kiara Harris, is a 22 year old female, accompanied by her mother, seen in request by her primary care from [facility]  Cherylann Parr for evaluation of chronic migraine, initial evaluation July 28, 2023    History is obtained from the patient and review of electronic medical records. I personally reviewed pertinent available imaging films in PACS.   PMHx of  Obesity Chronic migraine Pancreatic Mucinous cystic neoplasm Chronic low back pain. Polycystic ovarian syndrome Splenectomy  Depression, anxiety,   Strong family history of migraine, mother, grandmother suffered migraine, she began to have frequent headaches since 2022, now once weekly, vertex region pressure sometimes sharp headache with light noise sensitivity, nauseous, like going dark quiet room helps, it can last for hours to days, she has tried  [place] with limited help, Imitrex to work somewhat,  She had struggled with her weight issues, polycystic ovarian syndrome, also had laparoscopic distal pancreatectomy with splenectomy for  incidental findings of complex cystic mass at the tail of pancreas, measuring up to 4.4 x 3.6 cm, she had MRI of lumbar spine for complaints of low back pain,    MRI in August 2024 was normal in  [facility]  Laboratory evaluation: Hemoglobin 16.1, normal CMP, creatinine 0.67, A1c 5.6, ferritin was 10, hemogram of 14.9, thyroid functional test, B12, folic acid,    PHYSICAL EXAM:   Vitals:   07/28/23 0923  BP: 136/80  Pulse: 68  Weight: 276 lb (125.2 kg)  Height: 5\' 6"  (1.676 m)   Not recorded     Body mass index is 44.55 kg/m.  PHYSICAL EXAMNIATION:  Gen: NAD, conversant, well nourised, well groomed                     Cardiovascular: Regular rate rhythm, no peripheral edema, warm, nontender. Eyes: Conjunctivae clear without exudates or hemorrhage Neck: Supple, no carotid bruits. Pulmonary: Clear to auscultation bilaterally   NEUROLOGICAL EXAM:  MENTAL STATUS: Speech/cognition: Obese, awake, alert, oriented to history taking and casual conversation CRANIAL NERVES: CN II: Visual fields are full to confrontation. Pupils are round equal and briskly reactive to light. CN III, IV, VI: extraocular movement are normal. No ptosis. CN V: Facial sensation is intact to light touch CN VII: Face is symmetric with normal eye closure  CN VIII: Hearing is normal to causal conversation. CN IX, X: Phonation is normal. CN XI: Head turning and shoulder shrug are intact  MOTOR: There is no pronator drift of out-stretched arms. Muscle bulk and tone are normal. Muscle strength is normal.  REFLEXES: Reflexes are 2+ and symmetric at  the biceps, triceps, knees, and ankles. Plantar responses are flexor.  SENSORY: Intact to light touch, pinprick and vibratory sensation are intact in fingers and toes.  COORDINATION: There is no trunk or limb dysmetria noted.  GAIT/STANCE: Posture is normal. Gait is steady with normal steps, base, arm swing, and turning. Heel and toe walking are  normal.  REVIEW OF SYSTEMS:  Full 14 system review of systems performed and notable only for as above All other review of systems were negative.   ALLERGIES: Allergies  Allergen Reactions   Doxycycline Hyclate Nausea And Vomiting   Doxycycline Nausea And Vomiting   Latex Itching    With prolonged exposure=itching.   Sulfa Antibiotics     Other Reaction(s): Unknown   Amoxil [Amoxicillin] Rash   Multihance [Gadobenate] Itching    Hands and throat itching after MRI scan on 08/07/21. Pt would need 13 hr prep prior to MRI contrast. - Dr. Sebastian Ache.     HOME MEDICATIONS: Current Outpatient Medications  Medication Sig Dispense Refill   acetaminophen (TYLENOL) 325 MG tablet Take 650 mg by mouth every 6 (six) hours as needed for moderate pain.     albuterol (VENTOLIN HFA) 108 (90 Base) MCG/ACT inhaler Inhale 1-2 puffs into the lungs every 6 (six) hours as needed for wheezing or shortness of breath.     ALPRAZolam (XANAX) 0.25 MG tablet Take 0.25 mg by mouth daily as needed for anxiety.     buPROPion ER (WELLBUTRIN SR) 100 MG 12 hr tablet Take 100 mg by mouth daily.     Norethindrone-Ethinyl Estradiol-Fe Biphas (LO LOESTRIN FE) 1 MG-10 MCG / 10 MCG tablet Take 1 tablet by mouth daily. 28 tablet 11   ondansetron (ZOFRAN-ODT) 8 MG disintegrating tablet Take 8 mg by mouth every 8 (eight) hours.     pantoprazole (PROTONIX) 40 MG tablet Take 40 mg by mouth daily.     SUMAtriptan (IMITREX) 25 MG tablet TAKE 1 TABLET(25 MG) BY MOUTH 1 TIME AS NEEDED FOR MIGRAINE. MAY REPEAT DOSE 1 TIME IN 2 HOURS IF NO RELIEF. DO NOT EXCEED 2 DOSES IN 24 HOURS     topiramate (TOPAMAX) 25 MG tablet Take by mouth.     UBRELVY 100 MG TABS Take 100 mg by mouth daily as needed (migraine).     No current facility-administered medications for this visit.    PAST MEDICAL HISTORY: Past Medical History:  Diagnosis Date   Acid reflux    Anxiety and depression    Asthma    Cancer (HCC)    Pancreatic mucinous cystic  neoplasm   Family history of adverse reaction to anesthesia    Mother trouble waking   Migraine    PCOS (polycystic ovarian syndrome)     PAST SURGICAL HISTORY: Past Surgical History:  Procedure Laterality Date   BIOPSY  05/17/2022   Procedure: BIOPSY;  Surgeon: Lemar Lofty., MD;  Location: [facility];  Service: Gastroenterology;;   CHOLECYSTECTOMY N/A 11/15/2022   Procedure: LAPAROSCOPIC CHOLECYSTECTOMY;  Surgeon: Fritzi Mandes, MD;  Location: [facility] OR;  Service: General;  Laterality: N/A;   ESOPHAGOGASTRODUODENOSCOPY (EGD) WITH PROPOFOL N/A 05/17/2022   Procedure: ESOPHAGOGASTRODUODENOSCOPY (EGD) WITH PROPOFOL;  Surgeon: Lemar Lofty., MD;  Location: [facility];  Service: Gastroenterology;  Laterality: N/A;   EUS N/A 05/17/2022   Procedure: UPPER ENDOSCOPIC ULTRASOUND (EUS) LINEAR;  Surgeon: Lemar Lofty., MD;  Location: [facility];  Service: Gastroenterology;  Laterality: N/A;   FINE NEEDLE ASPIRATION  05/17/2022   Procedure: FINE NEEDLE  ASPIRATION (FNA) LINEAR;  Surgeon: Mansouraty, Netty Starring., MD;  Location: [facility];  Service: Gastroenterology;;   PANCREATECTOMY N/A 07/22/2022   Procedure: LAPAROSCOPIC DISTAL PANCREATECTOMY;  Surgeon: Fritzi Mandes, MD;  Location: [facility] OR;  Service: General;  Laterality: N/A;   SPLENECTOMY, TOTAL N/A 07/22/2022   Procedure: SPLENECTOMY;  Surgeon: Fritzi Mandes, MD;  Location: [facility] OR;  Service: General;  Laterality: N/A;   WISDOM TOOTH EXTRACTION Bilateral     FAMILY HISTORY: Family History  Problem Relation Age of Onset   Cirrhosis Mother        non-alcoholic cirrhosis   Cancer - Other Maternal Grandmother        gallbladder   Heart attack Maternal Grandfather     SOCIAL HISTORY: Social History   Socioeconomic History   Marital status: Significant Other    Spouse name: Not on file   Number of children: 0   Years of education: Not on file   Highest education level: Not on file  Occupational  History   Not on file  Tobacco Use   Smoking status: Every Day    Types: E-cigarettes   Smokeless tobacco: Never   Tobacco comments:    Quit vaping  Vaping Use   Vaping status: Every Day   Substances: Nicotine, Flavoring  Substance and Sexual Activity   Alcohol use: Yes    Comment: socially   Drug use: No   Sexual activity: Yes    Partners: Male    Birth control/protection: Condom    Comment: menarche 22yo, sexual debut 22yo  Other Topics Concern   Not on file  Social History Narrative   Live with mother    Social Drivers of Health   Financial Resource Strain: Not on File (10/09/2020)   Received from [employer], [employer]    Financial Resource Strain: 0  Food Insecurity: Not on File (05/05/2023)   Received from [facility] Insecurity    Food: 0  Transportation Needs: No Transportation Needs (07/22/2022)   PRAPARE - Administrator, Civil Service (Medical): No    Lack of Transportation (Non-Medical): No  Physical Activity: Not on File (10/09/2020)   Received from [place], [place]   Physical Activity    Physical Activity: 0  Stress: Not on File (10/09/2020)   Received from [facility], [place]   Stress    Stress: 0  Social Connections: Not on File (04/22/2023)   Received from [facility]   Social Connections    Connectedness: 0  Intimate Partner Violence: Not At Risk (07/22/2022)   Humiliation, Afraid, Rape, and Kick questionnaire    Fear of Current or Ex-Partner: No    Emotionally Abused: No    Physically Abused: No    Sexually Abused: No      Levert Feinstein, M.D. Ph.D.  [facility] [address] [place], [place] [address] Ph: [phone] Fax: [phone]  CC:  Fredrich Romans, FNP [address] [place],  [place] [address]  Gwenlyn Found, MD

## 2023-07-28 NOTE — Patient Instructions (Signed)
Meds ordered this encounter  Medications  1 rizatriptan (MAXALT-MLT) 10 MG disintegrating tablet    Sig: Take 1 tablet (10 mg total) by mouth as needed. May repeat in 2 hours if needed    Dispense:  12 tablet    Refill:  6   topiramate (TOPAMAX) 50 MG tablet    Sig: Take 2 tablets (100 mg total) by mouth at bedtime.    Dispense:  60 tablet    Refill:  11  2 ondansetron (ZOFRAN-ODT) 4 MG disintegrating tablet    Sig: Take 2 tablets (8 mg total) by mouth every 8 (eight) hours as needed for nausea or vomiting.    Dispense:  20 tablet    Refill:  6  3 tiZANidine (ZANAFLEX) 4 MG tablet    Sig: Take 1 tablet (4 mg total) by mouth every 6 (six) hours as needed for muscle spasms.    Dispense:  30 tablet    Refill:  6     4. Aleve

## 2024-02-13 ENCOUNTER — Telehealth: Payer: Self-pay | Admitting: Neurology

## 2024-02-13 NOTE — Telephone Encounter (Signed)
 Patient's mother, Ellouise cancelled patient's appointment due to scheduling conflict; patient has clinically.

## 2024-02-15 ENCOUNTER — Telehealth: Payer: 59 | Admitting: Family Medicine

## 2024-04-09 ENCOUNTER — Other Ambulatory Visit: Payer: Self-pay | Admitting: Radiology

## 2024-04-10 NOTE — Telephone Encounter (Signed)
 Med refill request: lo loestrin fe  Last AEX: 07/21/22 last OV 03/15/23 Next AEX: not scheduled, message sent to fd  Last MMG (if hormonal med) n/a Refill authorized: last rx 04/07/23 #28 with 11 refills. Please approve or deny

## 2024-04-26 ENCOUNTER — Other Ambulatory Visit (HOSPITAL_BASED_OUTPATIENT_CLINIC_OR_DEPARTMENT_OTHER): Payer: Self-pay

## 2024-04-26 MED ORDER — FLUZONE 0.5 ML IM SUSY
0.5000 mL | PREFILLED_SYRINGE | Freq: Once | INTRAMUSCULAR | 0 refills | Status: AC
  Filled 2024-04-26: qty 0.5, 1d supply, fill #0

## 2024-05-06 ENCOUNTER — Other Ambulatory Visit: Payer: Self-pay | Admitting: Radiology

## 2024-05-07 NOTE — Telephone Encounter (Signed)
 Med refill request:  LO LOESTRIN FE 1 MG-10 MCG / 10 MCG tablet Start: 04/10/24 Disp: #28 tablets with 0 refills  Last AEX:  07/21/22 LOV:  03/15/23 Next AEX: 10/03 25 Last MMG (if hormonal med): Not yet Refill authorized? Please Advise.

## 2024-05-11 ENCOUNTER — Ambulatory Visit (INDEPENDENT_AMBULATORY_CARE_PROVIDER_SITE_OTHER): Admitting: Radiology

## 2024-05-11 ENCOUNTER — Encounter: Payer: Self-pay | Admitting: Radiology

## 2024-05-11 VITALS — BP 124/72 | HR 95 | Ht 65.0 in | Wt 270.0 lb

## 2024-05-11 DIAGNOSIS — Z01419 Encounter for gynecological examination (general) (routine) without abnormal findings: Secondary | ICD-10-CM | POA: Diagnosis not present

## 2024-05-11 DIAGNOSIS — N921 Excessive and frequent menstruation with irregular cycle: Secondary | ICD-10-CM

## 2024-05-11 DIAGNOSIS — Z1331 Encounter for screening for depression: Secondary | ICD-10-CM | POA: Diagnosis not present

## 2024-05-11 DIAGNOSIS — Z3041 Encounter for surveillance of contraceptive pills: Secondary | ICD-10-CM

## 2024-05-11 MED ORDER — SLYND 4 MG PO TABS
1.0000 | ORAL_TABLET | Freq: Every day | ORAL | 4 refills | Status: AC
Start: 2024-05-11 — End: ?

## 2024-05-11 NOTE — Progress Notes (Signed)
   Kiara Harris 2001/04/25 [id number]   History:  23 y.o. G0 presents for annual exam. Still having BTB with OCPs. Currently on LoLoestrin, has also tried Junel and ortho cyclen and phexxi . Having headaches as well. Bleeding after sex. No new partners. Also using condoms.  Gynecologic History Patient's last menstrual period was 04/25/2024 (approximate). Period Cycle (Days):  (BTB with COCs x's 3 months) Period Duration (Days): 5-6 Period Pattern: (!) Irregular Menstrual Flow: Moderate, Heavy Menstrual Control: Tampon Dysmenorrhea: (!) Severe Dysmenorrhea Symptoms: Cramping Contraception/Family planning: condoms OCPs Sexually active: yes  Obstetric History OB History  Gravida Para Term Preterm AB Living  0 0 0 0 0 0  SAB IAB Ectopic Multiple Live Births  0 0 0 0 0      05/11/2024   11:29 AM 03/04/2015   10:29 AM 01/22/2015    9:47 AM  Depression screen PHQ 2/9  Decreased Interest 1  --  Down, Depressed, Hopeless 0 -- --  PHQ - 2 Score 1       The following portions of the patient's history were reviewed and updated as appropriate: allergies, current medications, past family history, past medical history, past social history, past surgical history, and problem list.  Review of Systems Pertinent items noted in HPI and remainder of comprehensive ROS otherwise negative.   Past medical history, past surgical history, family history and social history were all reviewed and documented in the EPIC chart.   Exam:  Vitals:   05/11/24 1131  BP: 124/72  Pulse: 95  SpO2: 98%  Weight: 270 lb (122.5 kg)  Height: 5' 5 (1.651 m)    Body mass index is 44.93 kg/m.  General appearance:  Normal Thyroid :  Symmetrical, normal in size, without palpable masses or nodularity. Respiratory  Auscultation:  Clear without wheezing or rhonchi Cardiovascular  Auscultation:  Regular rate, without rubs, murmurs or gallops  Edema/varicosities:  Not grossly  evident Abdominal  Soft,nontender, without masses, guarding or rebound.  Liver/spleen:  No organomegaly noted  Hernia:  None appreciated  Skin  Inspection:  Grossly normal Breasts: Examined lying and sitting.   Right: Without masses, retractions, nipple discharge or axillary adenopathy.   Left: Without masses, retractions, nipple discharge or axillary adenopathy. Genitourinary   Inguinal/mons:  Normal without inguinal adenopathy  External genitalia:  Normal appearing vulva with no masses, tenderness, or lesions  BUS/Urethra/Skene's glands:  Normal without masses or exudate  Vagina:  Normal appearing with normal color and discharge, no lesions  Cervix:  Normal appearing without discharge or lesions  Uterus:  Normal in size, shape and contour.  Mobile, nontender  Adnexa/parametria:     Rt: Normal in size, without masses or tenderness.   Lt: Normal in size, without masses or tenderness.  Anus and perineum: Normal   Darice Hoit, CMA present for exam  Assessment/Plan:   1. Well woman exam with routine gynecological exam (Primary) Pap 2026  2. Breakthrough bleeding Will change to slynd. If bleeding persist after 3 months u/s  3. Oral contraceptive pill surveillance - Drospirenone (SLYND) 4 MG TABS; Take 1 tablet (4 mg total) by mouth daily.  Dispense: 84 tablet; Refill: 4  4. Depression screening     Return in 1 year for annual or as needed.   GINETTE COZIER B WHNP-BC 11:50 AM 05/11/2024

## 2024-05-11 NOTE — Patient Instructions (Signed)

## 2024-08-08 ENCOUNTER — Other Ambulatory Visit: Payer: Self-pay | Admitting: Neurology

## 2024-08-08 NOTE — Telephone Encounter (Signed)
 Last seen on 07/28/23 No follow up scheduled
